# Patient Record
Sex: Male | Born: 1965
Health system: Southern US, Community
[De-identification: ages and names within clinical notes are randomized; demographics above are authoritative.]

## PROBLEM LIST (undated history)

## (undated) DIAGNOSIS — I10 Essential (primary) hypertension: Secondary | ICD-10-CM

## (undated) DIAGNOSIS — K859 Acute pancreatitis without necrosis or infection, unspecified: Secondary | ICD-10-CM

## (undated) HISTORY — PX: BILATERAL TEMPOROMANDIBULAR JOINT ARTHROPLASTY: SUR77

## (undated) HISTORY — PX: TRIGGER FINGER RELEASE: SHX641

---

## 2012-10-29 ENCOUNTER — Emergency Department (HOSPITAL_COMMUNITY)
Admission: EM | Admit: 2012-10-29 | Discharge: 2012-10-29 | Disposition: A | Discharge status: Home / Self Care | Payer: 59 | Attending: Emergency Medicine | Admitting: Emergency Medicine

## 2012-10-29 ENCOUNTER — Encounter (HOSPITAL_COMMUNITY): Payer: Self-pay | Admitting: Emergency Medicine

## 2012-10-29 ENCOUNTER — Emergency Department (HOSPITAL_COMMUNITY): Payer: 59

## 2012-10-29 DIAGNOSIS — F10239 Alcohol dependence with withdrawal, unspecified: Secondary | ICD-10-CM

## 2012-10-29 DIAGNOSIS — K529 Noninfective gastroenteritis and colitis, unspecified: Secondary | ICD-10-CM

## 2012-10-29 DIAGNOSIS — F1010 Alcohol abuse, uncomplicated: Secondary | ICD-10-CM | POA: Insufficient documentation

## 2012-10-29 DIAGNOSIS — F17200 Nicotine dependence, unspecified, uncomplicated: Secondary | ICD-10-CM | POA: Insufficient documentation

## 2012-10-29 DIAGNOSIS — R52 Pain, unspecified: Secondary | ICD-10-CM | POA: Insufficient documentation

## 2012-10-29 DIAGNOSIS — Z8719 Personal history of other diseases of the digestive system: Secondary | ICD-10-CM | POA: Insufficient documentation

## 2012-10-29 DIAGNOSIS — R197 Diarrhea, unspecified: Secondary | ICD-10-CM | POA: Insufficient documentation

## 2012-10-29 DIAGNOSIS — I10 Essential (primary) hypertension: Secondary | ICD-10-CM | POA: Insufficient documentation

## 2012-10-29 DIAGNOSIS — R509 Fever, unspecified: Secondary | ICD-10-CM | POA: Insufficient documentation

## 2012-10-29 DIAGNOSIS — K5289 Other specified noninfective gastroenteritis and colitis: Secondary | ICD-10-CM | POA: Insufficient documentation

## 2012-10-29 DIAGNOSIS — R059 Cough, unspecified: Secondary | ICD-10-CM | POA: Insufficient documentation

## 2012-10-29 DIAGNOSIS — E876 Hypokalemia: Secondary | ICD-10-CM | POA: Insufficient documentation

## 2012-10-29 DIAGNOSIS — R42 Dizziness and giddiness: Secondary | ICD-10-CM | POA: Insufficient documentation

## 2012-10-29 DIAGNOSIS — R05 Cough: Secondary | ICD-10-CM | POA: Insufficient documentation

## 2012-10-29 HISTORY — DX: Essential (primary) hypertension: I10

## 2012-10-29 HISTORY — DX: Acute pancreatitis without necrosis or infection, unspecified: K85.90

## 2012-10-29 LAB — CBC WITH DIFFERENTIAL/PLATELET
Basophils Absolute: 0 10*3/uL (ref 0.0–0.1)
Basophils Relative: 0 % (ref 0–1)
MCHC: 37.8 g/dL — ABNORMAL HIGH (ref 30.0–36.0)
Monocytes Absolute: 0.6 10*3/uL (ref 0.1–1.0)
Neutro Abs: 10.3 10*3/uL — ABNORMAL HIGH (ref 1.7–7.7)
Neutrophils Relative %: 92 % — ABNORMAL HIGH (ref 43–77)
RDW: 13 % (ref 11.5–15.5)

## 2012-10-29 LAB — URINALYSIS, ROUTINE W REFLEX MICROSCOPIC
Ketones, ur: 40 mg/dL — AB
Leukocytes, UA: NEGATIVE
Nitrite: NEGATIVE
Urobilinogen, UA: 1 mg/dL (ref 0.0–1.0)
pH: 6.5 (ref 5.0–8.0)

## 2012-10-29 LAB — COMPREHENSIVE METABOLIC PANEL
AST: 120 U/L — ABNORMAL HIGH (ref 0–37)
Albumin: 4 g/dL (ref 3.5–5.2)
Chloride: 86 mEq/L — ABNORMAL LOW (ref 96–112)
Creatinine, Ser: 0.71 mg/dL (ref 0.50–1.35)
Potassium: 2.6 mEq/L — CL (ref 3.5–5.1)
Total Bilirubin: 1.1 mg/dL (ref 0.3–1.2)

## 2012-10-29 MED ORDER — ONDANSETRON 8 MG PO TBDP: 8.0000 mg | ORAL_TABLET | Freq: Three times a day (TID) | ORAL | Status: DC | PRN

## 2012-10-29 MED ORDER — POTASSIUM CHLORIDE 10 MEQ/100ML IV SOLN
10.0000 meq | INTRAVENOUS | Status: AC
  Administered 2012-10-29 (×4): 10 meq via INTRAVENOUS
  Filled 2012-10-29 (×4): qty 100

## 2012-10-29 MED ORDER — DIPHENOXYLATE-ATROPINE 2.5-0.025 MG PO TABS: 1.0000 | ORAL_TABLET | Freq: Four times a day (QID) | ORAL | Status: DC | PRN

## 2012-10-29 MED ORDER — ONDANSETRON HCL 4 MG/2ML IJ SOLN
4.0000 mg | Freq: Once | INTRAMUSCULAR | Status: AC
  Administered 2012-10-29: 4 mg via INTRAVENOUS

## 2012-10-29 MED ORDER — LORAZEPAM 1 MG PO TABS: 1.0000 mg | ORAL_TABLET | Freq: Four times a day (QID) | ORAL | Status: DC | PRN

## 2012-10-29 MED ORDER — LORAZEPAM 2 MG/ML IJ SOLN: 1.0000 mg | Freq: Four times a day (QID) | INTRAMUSCULAR | Status: DC | PRN

## 2012-10-29 MED ORDER — SODIUM CHLORIDE 0.9 % IV BOLUS (SEPSIS)
1000.0000 mL | Freq: Once | INTRAVENOUS | Status: AC
  Administered 2012-10-29: 1000 mL via INTRAVENOUS

## 2012-10-29 MED ORDER — FOLIC ACID 1 MG PO TABS: 1.0000 mg | ORAL_TABLET | Freq: Every day | ORAL | Status: DC

## 2012-10-29 MED ORDER — POTASSIUM CHLORIDE CRYS ER 20 MEQ PO TBCR
40.0000 meq | EXTENDED_RELEASE_TABLET | Freq: Once | ORAL | Status: AC
  Administered 2012-10-29: 40 meq via ORAL
  Filled 2012-10-29: qty 2

## 2012-10-29 MED ORDER — DIPHENOXYLATE-ATROPINE 2.5-0.025 MG PO TABS
2.0000 | ORAL_TABLET | Freq: Once | ORAL | Status: AC
  Administered 2012-10-29: 2 via ORAL
  Filled 2012-10-29: qty 2

## 2012-10-29 MED ORDER — THIAMINE HCL 100 MG/ML IJ SOLN: 100.0000 mg | Freq: Every day | INTRAMUSCULAR | Status: DC

## 2012-10-29 MED ORDER — ADULT MULTIVITAMIN W/MINERALS CH: 1.0000 | ORAL_TABLET | Freq: Every day | ORAL | Status: DC

## 2012-10-29 MED ORDER — POTASSIUM CHLORIDE ER 10 MEQ PO TBCR: 20.0000 meq | EXTENDED_RELEASE_TABLET | Freq: Two times a day (BID) | ORAL | Status: DC

## 2012-10-29 MED ORDER — LORAZEPAM 2 MG/ML IJ SOLN
0.0000 mg | Freq: Four times a day (QID) | INTRAMUSCULAR | Status: DC
  Administered 2012-10-29 (×2): 2 mg via INTRAVENOUS
  Filled 2012-10-29: qty 2

## 2012-10-29 MED ORDER — ONDANSETRON HCL 4 MG/2ML IJ SOLN
INTRAMUSCULAR | Status: AC
  Administered 2012-10-29: 4 mg via INTRAVENOUS
  Filled 2012-10-29: qty 2

## 2012-10-29 MED ORDER — LORAZEPAM 2 MG/ML IJ SOLN: 0.0000 mg | Freq: Two times a day (BID) | INTRAMUSCULAR | Status: DC

## 2012-10-29 MED ORDER — VITAMIN B-1 100 MG PO TABS: 100.0000 mg | ORAL_TABLET | Freq: Every day | ORAL | Status: DC

## 2012-10-29 NOTE — ED Notes (Signed)
Pt reports hx of pancreatitis with similar symptoms. Reports taking alcohol 12/30 & 12/31.

## 2012-10-29 NOTE — ED Notes (Signed)
Patient is alert and oriented x3.  He was given DC instructions and follow up visit instructions.  Patient gave verbal understanding.  He was DC ambulatory under his own power to home.  V/S stable.  He was not showing any signs of distress on DC 

## 2012-10-29 NOTE — ED Provider Notes (Signed)
History     CSN: [id number]  Arrival date & time 10/29/12  0011   First MD Initiated Contact with Patient 10/29/12 0136      Chief Complaint  Patient presents with  . Nausea, vomiting and diarrhea     (Consider location/radiation/quality/duration/timing/severity/associated sxs/prior treatment) HPI This is a 47 year old male who developed flulike symptoms 4 days ago. By this he means body aches, fever and cough. For the past 2 days he has had nausea, vomiting and diarrhea. The symptoms have been moderate to severe. He's had fever to 102. He has felt lightheaded on standing and off balance when walking. He even vomited on arrival to the ED. He was treated with Zofran and an IV fluid bolus per protocol with improvement in his symptoms. He is somewhat evasive about his alcohol use but admits that some of his symptomatology may be due to his not having any alcohol for the past 2 days, notably he is very tremulous.  Past Medical History  Diagnosis Date  . Pancreatitis   . Hypertension     History reviewed. No pertinent past surgical history.  No family history on file.  History  Substance Use Topics  . Smoking status: Current Every Day Smoker  . Smokeless tobacco: Not on file  . Alcohol Use: Yes      Review of Systems  All other systems reviewed and are negative.    Allergies  Review of patient's allergies indicates no known allergies.  Home Medications   Current Outpatient Rx  Name  Route  Sig  Dispense  Refill  . ACETAMINOPHEN 325 MG PO TABS   Oral   Take 650 mg by mouth 2 (two) times daily as needed. For fever/pain         . NYQUIL D COLD/FLU PO   Oral   Take 15-30 mLs by mouth 2 (two) times daily as needed. For cold/flu symptoms           BP 133/100  Pulse 125  Temp 100.6 F (38.1 C) (Oral)  Resp 26  Wt 180 lb (81.647 kg)  SpO2 96%  Physical Exam General: Well-developed, well-nourished male in no acute distress; appearance consistent with age of  record HENT: normocephalic, atraumatic Eyes: pupils equal round and reactive to light; extraocular muscles intact Neck: supple Heart: regular rate and rhythm; tachycardia Lungs: clear to auscultation bilaterally Abdomen: soft; nondistended; nontender; no masses or hepatosplenomegaly; bowel sounds present Extremities: No deformity; full range of motion; pulses normal Neurologic: Awake, alert and oriented; motor function intact in all extremities and symmetric; no facial droop; tremulous Skin: Warm and dry Psychiatric: Normal mood and affect    ED Course  Procedures (including critical care time)    MDM  Nursing notes and vitals signs, including pulse oximetry, reviewed.  Summary of this visit's results, reviewed by myself:  Labs:  Results for orders placed during the hospital encounter of 10/29/12 (from the past 24 hour(s))  CBC WITH DIFFERENTIAL     Status: Abnormal   Collection Time   10/29/12 12:45 AM      Component Value Range   WBC 11.2 (*) 4.0 - 10.5 K/uL   RBC 4.71  4.22 - 5.81 MIL/uL   Hemoglobin 17.1 (*) 13.0 - 17.0 g/dL   HCT 66.4  40.3 - 47.4 %   MCV 96.0  78.0 - 100.0 fL   MCH 36.3 (*) 26.0 - 34.0 pg   MCHC 37.8 (*) 30.0 - 36.0 g/dL   RDW 25.9  56.3 -  15.5 %   Platelets 81 (*) 150 - 400 K/uL   Neutrophils Relative 92 (*) 43 - 77 %   Neutro Abs 10.3 (*) 1.7 - 7.7 K/uL   Lymphocytes Relative 3 (*) 12 - 46 %   Lymphs Abs 0.3 (*) 0.7 - 4.0 K/uL   Monocytes Relative 6  3 - 12 %   Monocytes Absolute 0.6  0.1 - 1.0 K/uL   Eosinophils Relative 0  0 - 5 %   Eosinophils Absolute 0.0  0.0 - 0.7 K/uL   Basophils Relative 0  0 - 1 %   Basophils Absolute 0.0  0.0 - 0.1 K/uL  COMPREHENSIVE METABOLIC PANEL     Status: Abnormal   Collection Time   10/29/12 12:45 AM      Component Value Range   Sodium 129 (*) 135 - 145 mEq/L   Potassium 2.6 (*) 3.5 - 5.1 mEq/L   Chloride 86 (*) 96 - 112 mEq/L   CO2 20  19 - 32 mEq/L   Glucose, Bld 183 (*) 70 - 99 mg/dL   BUN 8  6 - 23  mg/dL   Creatinine, Ser 6.96  0.50 - 1.35 mg/dL   Calcium 9.5  8.4 - 29.5 mg/dL   Total Protein 8.3  6.0 - 8.3 g/dL   Albumin 4.0  3.5 - 5.2 g/dL   AST 284 (*) 0 - 37 U/L   ALT 145 (*) 0 - 53 U/L   Alkaline Phosphatase 100  39 - 117 U/L   Total Bilirubin 1.1  0.3 - 1.2 mg/dL   GFR calc non Af Amer >90  >90 mL/min   GFR calc Af Amer >90  >90 mL/min  URINALYSIS, ROUTINE W REFLEX MICROSCOPIC     Status: Abnormal   Collection Time   10/29/12  2:40 AM      Component Value Range   Color, Urine AMBER (*) YELLOW   APPearance CLEAR  CLEAR   Specific Gravity, Urine 1.021  1.005 - 1.030   pH 6.5  5.0 - 8.0   Glucose, UA NEGATIVE  NEGATIVE mg/dL   Hgb urine dipstick SMALL (*) NEGATIVE   Bilirubin Urine MODERATE (*) NEGATIVE   Ketones, ur 40 (*) NEGATIVE mg/dL   Protein, ur >132 (*) NEGATIVE mg/dL   Urobilinogen, UA 1.0  0.0 - 1.0 mg/dL   Nitrite NEGATIVE  NEGATIVE   Leukocytes, UA NEGATIVE  NEGATIVE  URINE MICROSCOPIC-ADD ON     Status: Abnormal   Collection Time   10/29/12  2:40 AM      Component Value Range   RBC / HPF 3-6  <3 RBC/hpf   Bacteria, UA RARE  RARE   Casts HYALINE CASTS (*) NEGATIVE    Imaging Studies: Dg Abd Acute W/chest  10/29/2012  *RADIOLOGY REPORT*  Clinical Data: Worsening nausea and vomiting; weakness and mid abdominal pain.  Fever.  ACUTE ABDOMEN SERIES (ABDOMEN 2 VIEW & CHEST 1 VIEW)  Comparison: None.  Findings: The lungs are well-aerated and appear grossly clear. There is no evidence of focal opacification, pleural effusion or pneumothorax.  The cardiomediastinal silhouette is within normal limits.  Bilateral nipple shadows are seen.  The visualized bowel gas pattern is unremarkable.  Scattered stool and air are seen within the colon; there is no evidence of small bowel dilatation to suggest obstruction.  The stomach is partially filled with fluid and air.  No free intra-abdominal air is identified on the provided upright view.  No acute osseous abnormalities are seen;  mild sclerotic change is noted at the sacroiliac joints.  Mild degenerative change is noted at the inferior right glenoid.  IMPRESSION:  1.  Unremarkable bowel gas pattern; no free intra-abdominal air seen. 2.  No acute cardiopulmonary process identified.   Original Report Authenticated By: Tonia Ghent, M.D.    6:18 AM Patient's tremor, tachycardia and malaise significantly improved after IV Ativan, Zofran and 2 L of normal saline. He has also been given 40 mEq of potassium chloride IV for his hypokalemia. His been able to drink fluids without emesis.          Hanley Seamen, MD 10/29/12 [phone]

## 2012-10-29 NOTE — ED Notes (Signed)
Pt alert, arrives from home, c/o vomiting and diarrhea, onset was yesterday, per family recent ill exposures, resp even unlabored, skin pwd, emesis in triage

## 2012-10-29 NOTE — ED Notes (Signed)
Patient transported to X-ray 

## 2012-10-29 NOTE — ED Notes (Signed)
2.6 K reported to Dr. Read Drivers

## 2014-10-30 ENCOUNTER — Other Ambulatory Visit: Payer: Self-pay | Admitting: Internal Medicine

## 2014-10-30 DIAGNOSIS — R7989 Other specified abnormal findings of blood chemistry: Secondary | ICD-10-CM

## 2014-10-30 DIAGNOSIS — R945 Abnormal results of liver function studies: Principal | ICD-10-CM

## 2014-11-22 ENCOUNTER — Inpatient Hospital Stay: Admission: RE | Admit: 2014-11-22 | Payer: Self-pay | Source: Ambulatory Visit

## 2015-11-19 ENCOUNTER — Encounter (HOSPITAL_COMMUNITY): Payer: Self-pay | Admitting: Emergency Medicine

## 2015-11-19 ENCOUNTER — Emergency Department (HOSPITAL_COMMUNITY): Payer: 59

## 2015-11-19 ENCOUNTER — Emergency Department (HOSPITAL_COMMUNITY)
Admission: EM | Admit: 2015-11-19 | Discharge: 2015-11-20 | Disposition: A | Discharge status: Home / Self Care | Payer: 59 | Attending: Emergency Medicine | Admitting: Emergency Medicine

## 2015-11-19 DIAGNOSIS — R410 Disorientation, unspecified: Secondary | ICD-10-CM | POA: Diagnosis not present

## 2015-11-19 DIAGNOSIS — F17210 Nicotine dependence, cigarettes, uncomplicated: Secondary | ICD-10-CM | POA: Insufficient documentation

## 2015-11-19 DIAGNOSIS — Z7289 Other problems related to lifestyle: Secondary | ICD-10-CM

## 2015-11-19 DIAGNOSIS — R1110 Vomiting, unspecified: Secondary | ICD-10-CM | POA: Diagnosis present

## 2015-11-19 DIAGNOSIS — F10920 Alcohol use, unspecified with intoxication, uncomplicated: Secondary | ICD-10-CM | POA: Insufficient documentation

## 2015-11-19 DIAGNOSIS — Z8719 Personal history of other diseases of the digestive system: Secondary | ICD-10-CM | POA: Diagnosis not present

## 2015-11-19 DIAGNOSIS — R509 Fever, unspecified: Secondary | ICD-10-CM | POA: Diagnosis not present

## 2015-11-19 DIAGNOSIS — I10 Essential (primary) hypertension: Secondary | ICD-10-CM | POA: Diagnosis not present

## 2015-11-19 DIAGNOSIS — F1090 Alcohol use, unspecified, uncomplicated: Secondary | ICD-10-CM

## 2015-11-19 LAB — URINALYSIS, ROUTINE W REFLEX MICROSCOPIC
BILIRUBIN URINE: NEGATIVE
GLUCOSE, UA: NEGATIVE mg/dL
Hgb urine dipstick: NEGATIVE
KETONES UR: NEGATIVE mg/dL
LEUKOCYTES UA: NEGATIVE
NITRITE: NEGATIVE
PH: 7 (ref 5.0–8.0)
Protein, ur: NEGATIVE mg/dL
SPECIFIC GRAVITY, URINE: 1.006 (ref 1.005–1.030)

## 2015-11-19 LAB — COMPREHENSIVE METABOLIC PANEL
ALT: 80 U/L — ABNORMAL HIGH (ref 17–63)
ANION GAP: 14 (ref 5–15)
AST: 88 U/L — ABNORMAL HIGH (ref 15–41)
Albumin: 3.7 g/dL (ref 3.5–5.0)
Alkaline Phosphatase: 72 U/L (ref 38–126)
BUN: <5 mg/dL — ABNORMAL LOW (ref 6–20)
CALCIUM: 9.4 mg/dL (ref 8.9–10.3)
CHLORIDE: 109 mmol/L (ref 101–111)
CO2: 25 mmol/L (ref 22–32)
Creatinine, Ser: 0.85 mg/dL (ref 0.61–1.24)
GFR calc non Af Amer: >60 mL/min (ref >60)
Glucose, Bld: 110 mg/dL — ABNORMAL HIGH (ref 65–99)
POTASSIUM: 3.7 mmol/L (ref 3.5–5.1)
SODIUM: 148 mmol/L — AB (ref 135–145)
Total Bilirubin: 0.4 mg/dL (ref 0.3–1.2)
Total Protein: 6.9 g/dL (ref 6.5–8.1)

## 2015-11-19 LAB — CBC WITH DIFFERENTIAL/PLATELET
BASOS ABS: 0.1 10*3/uL (ref 0.0–0.1)
Basophils Relative: 2 %
EOS PCT: 6 %
Eosinophils Absolute: 0.4 10*3/uL (ref 0.0–0.7)
HCT: 42.2 % (ref 39.0–52.0)
HEMOGLOBIN: 14.5 g/dL (ref 13.0–17.0)
LYMPHS ABS: 2.7 10*3/uL (ref 0.7–4.0)
LYMPHS PCT: 40 %
MCH: 37.2 pg — AB (ref 26.0–34.0)
MCHC: 34.4 g/dL (ref 30.0–36.0)
MCV: 108.2 fL — AB (ref 78.0–100.0)
Monocytes Absolute: 0.8 10*3/uL (ref 0.1–1.0)
Monocytes Relative: 12 %
NEUTROS PCT: 40 %
Neutro Abs: 2.7 10*3/uL (ref 1.7–7.7)
PLATELETS: 170 10*3/uL (ref 150–400)
RBC: 3.9 MIL/uL — AB (ref 4.22–5.81)
RDW: 12.8 % (ref 11.5–15.5)
WBC: 6.7 10*3/uL (ref 4.0–10.5)

## 2015-11-19 LAB — I-STAT CG4 LACTIC ACID, ED
Lactic Acid, Venous: 2.15 mmol/L (ref 0.5–2.0)
Lactic Acid, Venous: 2.28 mmol/L (ref 0.5–2.0)

## 2015-11-19 LAB — ETHANOL: ALCOHOL ETHYL (B): 360 mg/dL — AB (ref <5)

## 2015-11-19 MED ORDER — LORAZEPAM 2 MG/ML IJ SOLN
1.0000 mg | Freq: Once | INTRAMUSCULAR | Status: AC
  Administered 2015-11-19: 1 mg via INTRAVENOUS
  Filled 2015-11-19: qty 1

## 2015-11-19 MED ORDER — VITAMIN B-1 100 MG PO TABS
100.0000 mg | ORAL_TABLET | Freq: Once | ORAL | Status: AC
  Administered 2015-11-19: 100 mg via ORAL
  Filled 2015-11-19: qty 1

## 2015-11-19 NOTE — ED Notes (Signed)
Critical ETOH 360, from [place] in lab.

## 2015-11-19 NOTE — ED Notes (Signed)
Urinal provided to pt

## 2015-11-19 NOTE — ED Notes (Addendum)
Pt c/o fever, vomiting x 3 days. Pt oral temperature reading 97.7 at this time. Pt does feel warmer than that. Per family, pt had fever of 102.3 at home. Pt c/o productive cough, weakness, SOB. A&Ox4. Pt appears very fatigued sitting in wheelchair. Pt has difficulty answering questions and is speaking very slowly. Pt has been confused per family.

## 2015-11-19 NOTE — ED Notes (Signed)
Requested patient to urinate. 

## 2015-11-19 NOTE — ED Provider Notes (Signed)
CSN: [id number]     Arrival date & time 11/19/15  1901 History   First MD Initiated Contact with Patient 11/19/15 1948     Chief Complaint  Patient presents with  . Fever  . Emesis     (Consider location/radiation/quality/duration/timing/severity/associated sxs/prior Treatment) Patient is a 50 y.o. male presenting with vomiting. The history is provided by the patient and a relative.  Emesis Severity:  Moderate Duration:  3 days Timing:  Constant Quality:  Stomach contents Progression:  Worsening Chronicity:  New Recent urination:  Normal Relieved by:  Nothing Worsened by:  Nothing tried Ineffective treatments:  None tried Associated symptoms: chills and fever   Associated symptoms comment:  Sweating Risk factors: alcohol use     Past Medical History  Diagnosis Date  . Pancreatitis   . Hypertension    History reviewed. No pertinent past surgical history. No family history on file. Social History  Substance Use Topics  . Smoking status: Current Every Day Smoker  . Smokeless tobacco: None  . Alcohol Use: Yes    Review of Systems  Constitutional: Positive for fever and chills.  Gastrointestinal: Positive for vomiting.  Psychiatric/Behavioral: Positive for confusion.  All other systems reviewed and are negative.     Allergies  Tramadol  Home Medications   Prior to Admission medications   Medication Sig Start Date End Date Taking? Authorizing Provider  acetaminophen (TYLENOL) 500 MG tablet Take 500-1,000 mg by mouth every 6 (six) hours as needed for mild pain, moderate pain or headache.   Yes Historical Provider, MD  Phenyleph-CPM-DM-Aspirin (ALKA-SELTZER PLUS COLD & COUGH PO) Take 1 each by mouth every 8 (eight) hours as needed (cold symptoms).   Yes Historical Provider, MD  diphenoxylate-atropine (LOMOTIL) 2.5-0.025 MG per tablet Take 1-2 tablets by mouth 4 (four) times daily as needed for diarrhea or loose stools. Patient not taking: Reported on 11/19/2015  10/29/12   Shanon Rosser, MD  potassium chloride (K-DUR) 10 MEQ tablet Take 2 tablets (20 mEq total) by mouth 2 (two) times daily. Patient not taking: Reported on 11/19/2015 10/29/12   John Molpus, MD   BP 100/76 mmHg  Pulse 116  Temp(Src) 98.4 F (36.9 C) (Rectal)  Resp 16  SpO2 92% Physical Exam  Constitutional: He is oriented to person, place, and time. He appears well-developed and well-nourished. No distress.  HENT:  Head: Normocephalic and atraumatic.  Eyes: Conjunctivae are normal.  Neck: Neck supple. No tracheal deviation present.  Cardiovascular: Normal rate and regular rhythm.   Pulmonary/Chest: Effort normal. No respiratory distress.  Abdominal: Soft. He exhibits no distension.  Neurological: He is alert and oriented to person, place, and time. No cranial nerve deficit. GCS eye subscore is 4. GCS verbal subscore is 5. GCS motor subscore is 6.  Skin: Skin is warm. He is diaphoretic.  Psychiatric: His speech is slurred. He is slowed.    ED Course  Procedures (including critical care time) Labs Review Labs Reviewed  COMPREHENSIVE METABOLIC PANEL - Abnormal; Notable for the following:    Sodium 148 (*)    Glucose, Bld 110 (*)    BUN <5 (*)    AST 88 (*)    ALT 80 (*)    All other components within normal limits  CBC WITH DIFFERENTIAL/PLATELET - Abnormal; Notable for the following:    RBC 3.90 (*)    MCV 108.2 (*)    MCH 37.2 (*)    All other components within normal limits  ETHANOL - Abnormal; Notable for the  following:    Alcohol, Ethyl (B) 360 (*)    All other components within normal limits  I-STAT CG4 LACTIC ACID, ED - Abnormal; Notable for the following:    Lactic Acid, Venous 2.15 (*)    All other components within normal limits  CULTURE, BLOOD (ROUTINE X 2)  CULTURE, BLOOD (ROUTINE X 2)  URINE CULTURE  URINALYSIS, ROUTINE W REFLEX MICROSCOPIC (NOT AT [facility])  I-STAT CG4 LACTIC ACID, ED    Imaging Review Dg Chest Port 1 View  11/19/2015  CLINICAL DATA:  Pt  unable to properly communicate history. Per ED notes:Pt c/o fever, vomiting x 3 days. Pt oral temperature reading 97.7 at this time. Fever at home. Productive cough, weakness, shortness of breath. Confusion. EXAM: PORTABLE CHEST 1 VIEW COMPARISON:  10/29/2012 FINDINGS: Shallow lung inflation. Subsegmental atelectasis or scarring identified at both lung bases. There are no consolidations. No pleural effusions or evidence for pulmonary edema. IMPRESSION: Bibasilar atelectasis or scarring. Electronically Signed   By: Nolon Nations M.D.   On: 11/19/2015 20:05   I have personally reviewed and evaluated these images and lab results as part of my medical decision-making.   EKG Interpretation None      MDM   Final diagnoses:  Alcohol intake above recommended sensible limits without complication [facility])    50 year old male presents with vomiting and concern for fevers that started 4 days ago and were last noted earlier today. Denying diarrhea. On arrival patient is sleepy, sweating profusely, appears mildly confused and with report of fevers and confusion there is clinical concern for withdrawal syndrome or wernicke encephalopathy. Provided Ativan and thiamine for empiric treatment pending ethanol level and fluids for mild clinical dehydration. Patient is apparently still able to consume his normal amount of liquor or potentially more given his level of 360. At this time appears that patient is clinically intoxicated and has slight lactic acidosis secondary to acute alcohol ingestion rather than infectious etiology. No leukocytosis, no signs of infection on chest x-ray or urinalysis, patient will need to metabolize alcohol and achieve clinical sobriety prior to discharge. Care transferred to Dr [facility] pending re-evaluation.    Leo Grosser, MD 11/21/15 [phone]

## 2015-11-19 NOTE — ED Notes (Signed)
Family requesting nicotine patch for pt

## 2015-11-20 LAB — URINE CULTURE: CULTURE: NO GROWTH

## 2015-11-20 NOTE — ED Notes (Signed)
Patient walked without any assistance. Patient did not fall or tumble. Patient is requesting to go home.

## 2015-11-20 NOTE — ED Provider Notes (Signed)
12:33 AM Able to ambulate without difficulty. States he is ready to leave.    Shanon Rosser, MD 11/20/15 [phone]

## 2015-11-24 LAB — CULTURE, BLOOD (ROUTINE X 2)
CULTURE: NO GROWTH
Culture: NO GROWTH

## 2016-05-25 ENCOUNTER — Observation Stay (HOSPITAL_COMMUNITY)
Admission: EM | Admit: 2016-05-25 | Discharge: 2016-05-28 | Disposition: A | Discharge status: Home / Self Care | Payer: 59 | Attending: Family Medicine | Admitting: Family Medicine

## 2016-05-25 ENCOUNTER — Encounter (HOSPITAL_COMMUNITY): Payer: Self-pay | Admitting: Emergency Medicine

## 2016-05-25 DIAGNOSIS — E878 Other disorders of electrolyte and fluid balance, not elsewhere classified: Secondary | ICD-10-CM | POA: Diagnosis present

## 2016-05-25 DIAGNOSIS — I10 Essential (primary) hypertension: Secondary | ICD-10-CM | POA: Insufficient documentation

## 2016-05-25 DIAGNOSIS — E871 Hypo-osmolality and hyponatremia: Secondary | ICD-10-CM | POA: Diagnosis not present

## 2016-05-25 DIAGNOSIS — I4581 Long QT syndrome: Secondary | ICD-10-CM | POA: Diagnosis not present

## 2016-05-25 DIAGNOSIS — E876 Hypokalemia: Secondary | ICD-10-CM | POA: Diagnosis not present

## 2016-05-25 DIAGNOSIS — R112 Nausea with vomiting, unspecified: Secondary | ICD-10-CM | POA: Diagnosis present

## 2016-05-25 DIAGNOSIS — F17200 Nicotine dependence, unspecified, uncomplicated: Secondary | ICD-10-CM | POA: Diagnosis not present

## 2016-05-25 DIAGNOSIS — R748 Abnormal levels of other serum enzymes: Secondary | ICD-10-CM | POA: Insufficient documentation

## 2016-05-25 DIAGNOSIS — R197 Diarrhea, unspecified: Secondary | ICD-10-CM

## 2016-05-25 DIAGNOSIS — M1990 Unspecified osteoarthritis, unspecified site: Secondary | ICD-10-CM | POA: Insufficient documentation

## 2016-05-25 DIAGNOSIS — A047 Enterocolitis due to Clostridium difficile: Principal | ICD-10-CM | POA: Insufficient documentation

## 2016-05-25 DIAGNOSIS — A0472 Enterocolitis due to Clostridium difficile, not specified as recurrent: Secondary | ICD-10-CM | POA: Diagnosis present

## 2016-05-25 DIAGNOSIS — R945 Abnormal results of liver function studies: Secondary | ICD-10-CM

## 2016-05-25 DIAGNOSIS — F1010 Alcohol abuse, uncomplicated: Secondary | ICD-10-CM | POA: Diagnosis present

## 2016-05-25 DIAGNOSIS — K859 Acute pancreatitis without necrosis or infection, unspecified: Secondary | ICD-10-CM | POA: Diagnosis present

## 2016-05-25 DIAGNOSIS — R7989 Other specified abnormal findings of blood chemistry: Secondary | ICD-10-CM | POA: Diagnosis present

## 2016-05-25 LAB — CBC
HEMATOCRIT: 36.8 % — AB (ref 39.0–52.0)
HEMOGLOBIN: 13.8 g/dL (ref 13.0–17.0)
MCH: 36.9 pg — AB (ref 26.0–34.0)
MCHC: 37.5 g/dL — AB (ref 30.0–36.0)
MCV: 98.4 fL (ref 78.0–100.0)
Platelets: 332 10*3/uL (ref 150–400)
RBC: 3.74 MIL/uL — ABNORMAL LOW (ref 4.22–5.81)
RDW: 12.2 % (ref 11.5–15.5)
WBC: 10.1 10*3/uL (ref 4.0–10.5)

## 2016-05-25 LAB — COMPREHENSIVE METABOLIC PANEL
ALBUMIN: 4.1 g/dL (ref 3.5–5.0)
ALK PHOS: 102 U/L (ref 38–126)
ALT: 83 U/L — ABNORMAL HIGH (ref 17–63)
ANION GAP: 20 — AB (ref 5–15)
AST: 67 U/L — ABNORMAL HIGH (ref 15–41)
BUN: 19 mg/dL (ref 6–20)
CALCIUM: 9 mg/dL (ref 8.9–10.3)
CO2: 32 mmol/L (ref 22–32)
Chloride: 69 mmol/L — ABNORMAL LOW (ref 101–111)
Creatinine, Ser: 0.93 mg/dL (ref 0.61–1.24)
GFR calc Af Amer: >60 mL/min (ref >60)
GFR calc non Af Amer: >60 mL/min (ref >60)
GLUCOSE: 114 mg/dL — AB (ref 65–99)
POTASSIUM: 2.1 mmol/L — AB (ref 3.5–5.1)
SODIUM: 121 mmol/L — AB (ref 135–145)
Total Bilirubin: 2 mg/dL — ABNORMAL HIGH (ref 0.3–1.2)
Total Protein: 8.4 g/dL — ABNORMAL HIGH (ref 6.5–8.1)

## 2016-05-25 LAB — LIPASE, BLOOD: Lipase: 371 U/L — ABNORMAL HIGH (ref 11–51)

## 2016-05-25 MED ORDER — POTASSIUM CHLORIDE 10 MEQ/100ML IV SOLN
10.0000 meq | INTRAVENOUS | Status: AC
  Administered 2016-05-25 – 2016-05-26 (×6): 10 meq via INTRAVENOUS
  Filled 2016-05-25 (×6): qty 100

## 2016-05-25 MED ORDER — SODIUM CHLORIDE 0.9 % IV BOLUS (SEPSIS)
2000.0000 mL | Freq: Once | INTRAVENOUS | Status: AC
  Administered 2016-05-25: 2000 mL via INTRAVENOUS

## 2016-05-25 NOTE — ED Provider Notes (Signed)
[facility] DEPT Provider Note   CSN: [id number] Arrival date & time: 05/25/16  2152  First Provider Contact:  First MD Initiated Contact with Patient 05/25/16 2337     By signing my name below, I, Higinio Plan, attest that this documentation has been prepared under the direction and in the presence of Shanon Rosser, MD . Electronically Signed: Higinio Plan, Scribe. 05/25/2016. 11:45 PM.  History   Chief Complaint Chief Complaint  Patient presents with  . Vomiting    HPI Comments: Jesse Sparks is a 50 y.o. male with PMHx of HTN and pancreatitis, who presents to the Emergency Department complaining of nausea and multiple episodes of vomiting that began ~1 week ago. He has also had 3 episodes of diarrhea. The vomiting has progressed to dry heaving. He reports associated muscle aches, abdominal wall pain which he attributes to vomiting, and generalized weakness. His rates his pain a 5 out of 10. He states he has experienced similar symptoms once before. He notes he lost ~20 lbs this past week due to inability to tolerate foods and liquids. He states he has taken Bonine for his symptoms with mild relief. He denies drinking alcohol this week. He denies dry mouth.   HPI  Past Medical History:  Diagnosis Date  . Hypertension   . Pancreatitis     There are no active problems to display for this patient.   Past Surgical History:  Procedure Laterality Date  . BILATERAL TEMPOROMANDIBULAR JOINT ARTHROPLASTY    . TRIGGER FINGER RELEASE      Home Medications    Prior to Admission medications   Medication Sig Start Date End Date Taking? Authorizing Provider  acetaminophen (TYLENOL) 500 MG tablet Take 500-1,000 mg by mouth every 6 (six) hours as needed for mild pain, moderate pain or headache.    Historical Provider, MD  diphenoxylate-atropine (LOMOTIL) 2.5-0.025 MG per tablet Take 1-2 tablets by mouth 4 (four) times daily as needed for diarrhea or loose stools. Patient not taking: Reported  on 11/19/2015 10/29/12   Shanon Rosser, MD  Phenyleph-CPM-DM-Aspirin (ALKA-SELTZER PLUS COLD & COUGH PO) Take 1 each by mouth every 8 (eight) hours as needed (cold symptoms).    Historical Provider, MD  potassium chloride (K-DUR) 10 MEQ tablet Take 2 tablets (20 mEq total) by mouth 2 (two) times daily. Patient not taking: Reported on 11/19/2015 10/29/12   Shanon Rosser, MD    Family History History reviewed. No pertinent family history.  Social History Social History  Substance Use Topics  . Smoking status: Current Every Day Smoker  . Smokeless tobacco: Never Used  . Alcohol use Yes     Allergies   Tramadol   Review of Systems Review of Systems 10 systems reviewed and all are negative for acute change except as noted in the HPI.  Physical Exam Updated Vital Signs BP 112/87 (BP Location: Left Arm)   Pulse 109   Temp 99.3 F (37.4 C) (Oral)   Resp 18   Ht 5\' 8"  (1.727 m)   Wt 165 lb 4 oz (75 kg)   SpO2 100%   BMI 25.13 kg/m   Physical Exam General: Well-developed male in no acute distress; appearance consistent with age of record HENT: normocephalic; atraumatic Eyes: pupils equal, round and reactive to light; extraocular muscles intact Neck: supple Heart: regular rate and rhythm; tachycardia Lungs: clear to auscultation bilaterally Abdomen: soft; nondistended; RLQ tenderness; no masses or hepatosplenomegaly; bowel sounds present Extremities: No deformity; full range of motion; pulses normal Neurologic: Awake,  alert and oriented; motor function intact in all extremities and symmetric; no facial droop Skin: Warm and dry Psychiatric: Normal mood and affect  ED Treatments / Results   Nursing notes and vitals signs, including pulse oximetry, reviewed.  Summary of this visit's results, reviewed by myself:  Labs:  Results for orders placed or performed during the hospital encounter of 05/25/16 (from the past 24 hour(s))  Lipase, blood     Status: Abnormal   Collection Time:  05/25/16 10:43 PM  Result Value Ref Range   Lipase 371 (H) 11 - 51 U/L  Comprehensive metabolic panel     Status: Abnormal   Collection Time: 05/25/16 10:43 PM  Result Value Ref Range   Sodium 121 (L) 135 - 145 mmol/L   Potassium 2.1 (LL) 3.5 - 5.1 mmol/L   Chloride 69 (L) 101 - 111 mmol/L   CO2 32 22 - 32 mmol/L   Glucose, Bld 114 (H) 65 - 99 mg/dL   BUN 19 6 - 20 mg/dL   Creatinine, Ser 0.93 0.61 - 1.24 mg/dL   Calcium 9.0 8.9 - 10.3 mg/dL   Total Protein 8.4 (H) 6.5 - 8.1 g/dL   Albumin 4.1 3.5 - 5.0 g/dL   AST 67 (H) 15 - 41 U/L   ALT 83 (H) 17 - 63 U/L   Alkaline Phosphatase 102 38 - 126 U/L   Total Bilirubin 2.0 (H) 0.3 - 1.2 mg/dL   GFR calc non Af Amer >60 >60 mL/min   GFR calc Af Amer >60 >60 mL/min   Anion gap 20 (H) 5 - 15  CBC     Status: Abnormal   Collection Time: 05/25/16 10:43 PM  Result Value Ref Range   WBC 10.1 4.0 - 10.5 K/uL   RBC 3.74 (L) 4.22 - 5.81 MIL/uL   Hemoglobin 13.8 13.0 - 17.0 g/dL   HCT 36.8 (L) 39.0 - 52.0 %   MCV 98.4 78.0 - 100.0 fL   MCH 36.9 (H) 26.0 - 34.0 pg   MCHC 37.5 (H) 30.0 - 36.0 g/dL   RDW 12.2 11.5 - 15.5 %   Platelets 332 150 - 400 K/uL    EKG Interpretation  Date/Time:  Sunday May 25 2016 23:53:09 EDT Ventricular Rate:  95 PR Interval:    QRS Duration: 109 QT Interval:  407 QTC Calculation: 512 R Axis:   91 Text Interpretation:  Sinus rhythm Borderline right axis deviation Prolonged QT interval No previous ECGs available Confirmed by Johnryan Sao  MD, Jenny Reichmann ([id number]) on 05/26/2016 12:04:07 AM       Procedures   Final Clinical Impressions(s) / ED Diagnoses  12:07 AM IV fluid bolus and IV runs of potassium initiated.   12:32 AM Dr. Hal Hope to admit to stepdown.  Final diagnoses:  Nausea, vomiting and diarrhea  Hyponatremia  Hypokalemia, gastrointestinal losses  Elevated lipase   I personally performed the services described in this documentation, which was scribed in my presence. The recorded information has  been reviewed and is accurate.     Shanon Rosser, MD 05/26/16 [phone]

## 2016-05-25 NOTE — ED Triage Notes (Signed)
Pt states he has had nausea, vomiting, and diarrhea for one week  Denies abd pain just severe nausea  Pt states he normally weighs about 185 and today actual weight is 165.4

## 2016-05-26 ENCOUNTER — Observation Stay (HOSPITAL_COMMUNITY): Payer: 59

## 2016-05-26 ENCOUNTER — Encounter (HOSPITAL_COMMUNITY): Payer: Self-pay | Admitting: Internal Medicine

## 2016-05-26 DIAGNOSIS — R112 Nausea with vomiting, unspecified: Secondary | ICD-10-CM | POA: Diagnosis not present

## 2016-05-26 DIAGNOSIS — E871 Hypo-osmolality and hyponatremia: Secondary | ICD-10-CM | POA: Diagnosis present

## 2016-05-26 DIAGNOSIS — R7989 Other specified abnormal findings of blood chemistry: Secondary | ICD-10-CM | POA: Diagnosis not present

## 2016-05-26 DIAGNOSIS — R945 Abnormal results of liver function studies: Secondary | ICD-10-CM

## 2016-05-26 DIAGNOSIS — F1010 Alcohol abuse, uncomplicated: Secondary | ICD-10-CM | POA: Diagnosis present

## 2016-05-26 DIAGNOSIS — E878 Other disorders of electrolyte and fluid balance, not elsewhere classified: Secondary | ICD-10-CM | POA: Diagnosis present

## 2016-05-26 DIAGNOSIS — K859 Acute pancreatitis without necrosis or infection, unspecified: Secondary | ICD-10-CM | POA: Diagnosis present

## 2016-05-26 DIAGNOSIS — R197 Diarrhea, unspecified: Secondary | ICD-10-CM

## 2016-05-26 LAB — DIFFERENTIAL
BASOS ABS: 0.1 10*3/uL (ref 0.0–0.1)
Basophils Relative: 1 %
Eosinophils Absolute: 0.1 10*3/uL (ref 0.0–0.7)
Eosinophils Relative: 1 %
LYMPHS ABS: 1.3 10*3/uL (ref 0.7–4.0)
LYMPHS PCT: 13 %
Monocytes Absolute: 1.4 10*3/uL — ABNORMAL HIGH (ref 0.1–1.0)
Monocytes Relative: 13 %
NEUTROS ABS: 7.3 10*3/uL (ref 1.7–7.7)
NEUTROS PCT: 72 %

## 2016-05-26 LAB — URINALYSIS, ROUTINE W REFLEX MICROSCOPIC
Glucose, UA: NEGATIVE mg/dL
Hgb urine dipstick: NEGATIVE
Ketones, ur: 15 mg/dL — AB
LEUKOCYTES UA: NEGATIVE
NITRITE: NEGATIVE
PH: 6 (ref 5.0–8.0)
Protein, ur: NEGATIVE mg/dL
SPECIFIC GRAVITY, URINE: 1.015 (ref 1.005–1.030)

## 2016-05-26 LAB — SODIUM, URINE, RANDOM

## 2016-05-26 LAB — CK: CK TOTAL: 46 U/L — AB (ref 49–397)

## 2016-05-26 LAB — HEPATIC FUNCTION PANEL
ALK PHOS: 77 U/L (ref 38–126)
ALT: 60 U/L (ref 17–63)
AST: 40 U/L (ref 15–41)
Albumin: 3.3 g/dL — ABNORMAL LOW (ref 3.5–5.0)
BILIRUBIN INDIRECT: 1 mg/dL — AB (ref 0.3–0.9)
BILIRUBIN TOTAL: 1.3 mg/dL — AB (ref 0.3–1.2)
Bilirubin, Direct: 0.3 mg/dL (ref 0.1–0.5)
TOTAL PROTEIN: 6.8 g/dL (ref 6.5–8.1)

## 2016-05-26 LAB — BASIC METABOLIC PANEL
Anion gap: 13 (ref 5–15)
Anion gap: 12 (ref 5–15)
Anion gap: 12 (ref 5–15)
Anion gap: 10 (ref 5–15)
BUN: 14 mg/dL (ref 6–20)
BUN: 14 mg/dL (ref 6–20)
BUN: 13 mg/dL (ref 6–20)
BUN: 13 mg/dL (ref 6–20)
CHLORIDE: 81 mmol/L — AB (ref 101–111)
CHLORIDE: 81 mmol/L — AB (ref 101–111)
CHLORIDE: 84 mmol/L — AB (ref 101–111)
CHLORIDE: 86 mmol/L — AB (ref 101–111)
CO2: 31 mmol/L (ref 22–32)
CO2: 32 mmol/L (ref 22–32)
CO2: 32 mmol/L (ref 22–32)
CO2: 32 mmol/L (ref 22–32)
CREATININE: 0.69 mg/dL (ref 0.61–1.24)
CREATININE: 0.79 mg/dL (ref 0.61–1.24)
Calcium: 7.9 mg/dL — ABNORMAL LOW (ref 8.9–10.3)
Calcium: 8.2 mg/dL — ABNORMAL LOW (ref 8.9–10.3)
Calcium: 8.2 mg/dL — ABNORMAL LOW (ref 8.9–10.3)
Calcium: 8.6 mg/dL — ABNORMAL LOW (ref 8.9–10.3)
Creatinine, Ser: 0.85 mg/dL (ref 0.61–1.24)
Creatinine, Ser: 0.78 mg/dL (ref 0.61–1.24)
GFR calc Af Amer: >60 mL/min (ref >60)
GFR calc Af Amer: >60 mL/min (ref >60)
GFR calc Af Amer: >60 mL/min (ref >60)
GFR calc Af Amer: >60 mL/min (ref >60)
GFR calc non Af Amer: >60 mL/min (ref >60)
GFR calc non Af Amer: >60 mL/min (ref >60)
GFR calc non Af Amer: >60 mL/min (ref >60)
GFR calc non Af Amer: >60 mL/min (ref >60)
GLUCOSE: 88 mg/dL (ref 65–99)
GLUCOSE: 91 mg/dL (ref 65–99)
GLUCOSE: 85 mg/dL (ref 65–99)
GLUCOSE: 100 mg/dL — AB (ref 65–99)
POTASSIUM: 2.6 mmol/L — AB (ref 3.5–5.1)
POTASSIUM: 2.5 mmol/L — AB (ref 3.5–5.1)
POTASSIUM: 3.1 mmol/L — AB (ref 3.5–5.1)
POTASSIUM: 3.2 mmol/L — AB (ref 3.5–5.1)
SODIUM: 128 mmol/L — AB (ref 135–145)
Sodium: 125 mmol/L — ABNORMAL LOW (ref 135–145)
Sodium: 125 mmol/L — ABNORMAL LOW (ref 135–145)
Sodium: 128 mmol/L — ABNORMAL LOW (ref 135–145)

## 2016-05-26 LAB — MAGNESIUM
MAGNESIUM: 1.3 mg/dL — AB (ref 1.7–2.4)
MAGNESIUM: 1.9 mg/dL (ref 1.7–2.4)

## 2016-05-26 LAB — CBC WITH DIFFERENTIAL/PLATELET
BASOS ABS: 0.1 10*3/uL (ref 0.0–0.1)
Basophils Relative: 1 %
EOS ABS: 0.1 10*3/uL (ref 0.0–0.7)
EOS PCT: 1 %
HCT: 31.1 % — ABNORMAL LOW (ref 39.0–52.0)
Hemoglobin: 11.4 g/dL — ABNORMAL LOW (ref 13.0–17.0)
LYMPHS PCT: 18 %
Lymphs Abs: 1.2 10*3/uL (ref 0.7–4.0)
MCH: 36.3 pg — ABNORMAL HIGH (ref 26.0–34.0)
MCHC: 36.7 g/dL — ABNORMAL HIGH (ref 30.0–36.0)
MCV: 99 fL (ref 78.0–100.0)
MONO ABS: 0.8 10*3/uL (ref 0.1–1.0)
Monocytes Relative: 11 %
Neutro Abs: 4.8 10*3/uL (ref 1.7–7.7)
Neutrophils Relative %: 69 %
PLATELETS: 279 10*3/uL (ref 150–400)
RBC: 3.14 MIL/uL — AB (ref 4.22–5.81)
RDW: 12.2 % (ref 11.5–15.5)
WBC: 7 10*3/uL (ref 4.0–10.5)

## 2016-05-26 LAB — OSMOLALITY, URINE: OSMOLALITY UR: 351 mosm/kg (ref 300–900)

## 2016-05-26 LAB — MRSA PCR SCREENING: MRSA by PCR: NEGATIVE

## 2016-05-26 LAB — GLUCOSE, CAPILLARY
Glucose-Capillary: 101 mg/dL — ABNORMAL HIGH (ref 65–99)
Glucose-Capillary: 93 mg/dL (ref 65–99)
Glucose-Capillary: 99 mg/dL (ref 65–99)

## 2016-05-26 LAB — CORTISOL: CORTISOL PLASMA: 17.2 ug/dL

## 2016-05-26 LAB — TROPONIN I: Troponin I: <0.03 ng/mL (ref <0.03)

## 2016-05-26 LAB — TSH: TSH: 1.009 u[IU]/mL (ref 0.350–4.500)

## 2016-05-26 LAB — PROTIME-INR
INR: 1.04
PROTHROMBIN TIME: 13.6 s (ref 11.4–15.2)

## 2016-05-26 MED ORDER — IOPAMIDOL (ISOVUE-300) INJECTION 61%
100.0000 mL | Freq: Once | INTRAVENOUS | Status: AC | PRN
Start: 2016-05-26 — End: 2016-05-26
  Administered 2016-05-26: 100 mL via INTRAVENOUS

## 2016-05-26 MED ORDER — LORAZEPAM 1 MG PO TABS: 1.0000 mg | ORAL_TABLET | Freq: Four times a day (QID) | ORAL | Status: DC | PRN

## 2016-05-26 MED ORDER — LORAZEPAM 2 MG/ML IJ SOLN: 1.0000 mg | Freq: Four times a day (QID) | INTRAMUSCULAR | Status: DC | PRN

## 2016-05-26 MED ORDER — MAGNESIUM SULFATE 2 GM/50ML IV SOLN
2.0000 g | Freq: Once | INTRAVENOUS | Status: AC
  Administered 2016-05-26: 2 g via INTRAVENOUS
  Filled 2016-05-26: qty 50

## 2016-05-26 MED ORDER — VITAMIN B-1 100 MG PO TABS
100.0000 mg | ORAL_TABLET | Freq: Every day | ORAL | Status: DC
  Administered 2016-05-26 – 2016-05-28 (×3): 100 mg via ORAL
  Filled 2016-05-26 (×3): qty 1

## 2016-05-26 MED ORDER — MAGNESIUM SULFATE 50 % IJ SOLN: 2.0000 g | Freq: Once | INTRAMUSCULAR | Status: DC

## 2016-05-26 MED ORDER — POTASSIUM CHLORIDE CRYS ER 20 MEQ PO TBCR
40.0000 meq | EXTENDED_RELEASE_TABLET | ORAL | Status: AC
  Administered 2016-05-26 (×3): 40 meq via ORAL
  Filled 2016-05-26 (×3): qty 2

## 2016-05-26 MED ORDER — FOLIC ACID 1 MG PO TABS
1.0000 mg | ORAL_TABLET | Freq: Every day | ORAL | Status: DC
  Administered 2016-05-26 – 2016-05-28 (×3): 1 mg via ORAL
  Filled 2016-05-26 (×3): qty 1

## 2016-05-26 MED ORDER — ONDANSETRON HCL 4 MG PO TABS
4.0000 mg | ORAL_TABLET | Freq: Four times a day (QID) | ORAL | Status: DC | PRN
Start: 2016-05-26 — End: 2016-05-28

## 2016-05-26 MED ORDER — POTASSIUM CHLORIDE CRYS ER 20 MEQ PO TBCR
40.0000 meq | EXTENDED_RELEASE_TABLET | ORAL | Status: DC
Start: 2016-05-26 — End: 2016-05-26

## 2016-05-26 MED ORDER — ADULT MULTIVITAMIN W/MINERALS CH
1.0000 | ORAL_TABLET | Freq: Every day | ORAL | Status: DC
  Administered 2016-05-26 – 2016-05-28 (×3): 1 via ORAL
  Filled 2016-05-26 (×4): qty 1

## 2016-05-26 MED ORDER — SODIUM CHLORIDE 0.9 % IV SOLN
INTRAVENOUS | Status: DC
  Administered 2016-05-27: 11:00:00 via INTRAVENOUS

## 2016-05-26 MED ORDER — LORAZEPAM 2 MG/ML IJ SOLN
0.0000 mg | Freq: Four times a day (QID) | INTRAMUSCULAR | Status: AC
  Administered 2016-05-26: 1 mg via INTRAVENOUS
  Administered 2016-05-27: 2 mg via INTRAVENOUS
  Filled 2016-05-26 (×2): qty 1

## 2016-05-26 MED ORDER — LORAZEPAM 2 MG/ML IJ SOLN
1.0000 mg | Freq: Once | INTRAMUSCULAR | Status: AC
  Administered 2016-05-26: 1 mg via INTRAVENOUS
  Filled 2016-05-26: qty 1

## 2016-05-26 MED ORDER — THIAMINE HCL 100 MG/ML IJ SOLN
100.0000 mg | Freq: Every day | INTRAMUSCULAR | Status: DC
  Filled 2016-05-26: qty 2

## 2016-05-26 MED ORDER — ONDANSETRON HCL 4 MG/2ML IJ SOLN
4.0000 mg | Freq: Four times a day (QID) | INTRAMUSCULAR | Status: DC | PRN
  Administered 2016-05-26: 4 mg via INTRAVENOUS
  Filled 2016-05-26: qty 2

## 2016-05-26 MED ORDER — OXYCODONE-ACETAMINOPHEN 5-325 MG PO TABS
1.0000 | ORAL_TABLET | Freq: Four times a day (QID) | ORAL | Status: DC | PRN
  Administered 2016-05-26 – 2016-05-27 (×2): 1 via ORAL
  Filled 2016-05-26 (×2): qty 1

## 2016-05-26 MED ORDER — LORAZEPAM 2 MG/ML IJ SOLN: 0.0000 mg | Freq: Two times a day (BID) | INTRAMUSCULAR | Status: DC

## 2016-05-26 NOTE — H&P (Signed)
History and Physical    Jesse Sparks [id number] DOB: 12/04/1965 DOA: 05/25/2016  PCP: No primary care provider on file.  Patient coming from: Home.  Chief Complaint: Weakness.  HPI: Jesse Sparks is a 50 y.o. male with alcoholism and nonspecific arthritis presents to the ER because of increasing weakness with nausea vomiting and diarrhea over the last 1 week. Denies any abdominal pain chest pain or shortness of breath. In the ER patient had mild epigastric tenderness. Labs revealed severe hyponatremia hypokalemia and hypomagnesemia. Patient states he drinks alcohol every day. Denies any recent sick contacts or use of antibiotics. In addition labs reveal elevated LFT and lipase. Patient is being admitted for further management of his multiple electrolyte abnormalities and nausea vomiting and diarrhea.   ED Course: Patient was given 1.8 L of fluid bolus.  Review of Systems: As per HPI, rest all negative.   Past Medical History:  Diagnosis Date  . Hypertension   . Pancreatitis     Past Surgical History:  Procedure Laterality Date  . BILATERAL TEMPOROMANDIBULAR JOINT ARTHROPLASTY    . TRIGGER FINGER RELEASE       reports that he has been smoking.  He has never used smokeless tobacco. He reports that he drinks alcohol. He reports that he does not use drugs.  Allergies  Allergen Reactions  . Tramadol Rash    Gi upset     Family History  Problem Relation Age of Onset  . Melanoma Mother     Prior to Admission medications   Medication Sig Start Date End Date Taking? Authorizing Provider  meloxicam (MOBIC) 15 MG tablet Take 15 mg by mouth daily as needed for pain. with food 04/21/16  Yes Historical Provider, MD    Physical Exam: Vitals:   05/25/16 2223 05/25/16 2359 05/26/16 0030 05/26/16 0032  BP: 112/87 118/86 126/95 126/95  Pulse: 109 95 97 87  Resp: 18 11 15    Temp: 99.3 F (37.4 C)     TempSrc: Oral     SpO2: 100% 97% 98%   Weight: 165 lb 4 oz (75 kg)       Height: 5\' 8"  (1.727 m)         Constitutional: Not in distress. Vitals:   05/25/16 2223 05/25/16 2359 05/26/16 0030 05/26/16 0032  BP: 112/87 118/86 126/95 126/95  Pulse: 109 95 97 87  Resp: 18 11 15    Temp: 99.3 F (37.4 C)     TempSrc: Oral     SpO2: 100% 97% 98%   Weight: 165 lb 4 oz (75 kg)     Height: 5\' 8"  (1.727 m)      Eyes: Anicteric no pallor. ENMT: No discharge from the ears eyes nose and mouth. Neck: No mass felt. Respiratory: No rhonchi or crepitations. Cardiovascular: S1 and S2 heard. Abdomen: Soft mild epigastric discomfort no guarding or rigidity. Musculoskeletal: No edema. Skin: No rash. Neurologic: Alert awake oriented to time place and person. Moves all extremities. Psychiatric: Appears normal.   Labs on Admission: I have personally reviewed following labs and imaging studies  CBC:  Recent Labs Lab 05/25/16 2243  WBC 10.1  NEUTROABS 7.3  HGB 13.8  HCT 36.8*  MCV 98.4  PLT [id number]   Basic Metabolic Panel:  Recent Labs Lab 05/25/16 2243  NA 121*  K 2.1*  CL 69*  CO2 32  GLUCOSE 114*  BUN 19  CREATININE 0.93  CALCIUM 9.0  MG 1.3*   GFR: Estimated Creatinine Clearance: 91.9 mL/min (by C-G  formula based on SCr of 0.93 mg/dL). Liver Function Tests:  Recent Labs Lab 05/25/16 2243  AST 67*  ALT 83*  ALKPHOS 102  BILITOT 2.0*  PROT 8.4*  ALBUMIN 4.1    Recent Labs Lab 05/25/16 2243  LIPASE 371*   No results for input(s): AMMONIA in the last 168 hours. Coagulation Profile: No results for input(s): INR, PROTIME in the last 168 hours. Cardiac Enzymes: No results for input(s): CKTOTAL, CKMB, CKMBINDEX, TROPONINI in the last 168 hours. BNP (last 3 results) No results for input(s): PROBNP in the last 8760 hours. HbA1C: No results for input(s): HGBA1C in the last 72 hours. CBG: No results for input(s): GLUCAP in the last 168 hours. Lipid Profile: No results for input(s): CHOL, HDL, LDLCALC, TRIG, CHOLHDL, LDLDIRECT in the  last 72 hours. Thyroid Function Tests: No results for input(s): TSH, T4TOTAL, FREET4, T3FREE, THYROIDAB in the last 72 hours. Anemia Panel: No results for input(s): VITAMINB12, FOLATE, FERRITIN, TIBC, IRON, RETICCTPCT in the last 72 hours. Urine analysis:    Component Value Date/Time   COLORURINE AMBER (A) 05/25/2016 0032   APPEARANCEUR CLEAR 05/25/2016 0032   LABSPEC 1.015 05/25/2016 0032   PHURINE 6.0 05/25/2016 0032   GLUCOSEU NEGATIVE 05/25/2016 0032   HGBUR NEGATIVE 05/25/2016 0032   BILIRUBINUR MODERATE (A) 05/25/2016 0032   KETONESUR 15 (A) 05/25/2016 0032   PROTEINUR NEGATIVE 05/25/2016 0032   UROBILINOGEN 1.0 10/29/2012 0240   NITRITE NEGATIVE 05/25/2016 0032   LEUKOCYTESUR NEGATIVE 05/25/2016 0032   Sepsis Labs: @LABRCNTIP (procalcitonin:4,lacticidven:4) )No results found for this or any previous visit (from the past 240 hour(s)).   Radiological Exams on Admission: No results found.  EKG: Independently reviewed. Normal sinus rhythm with prolonged QTC of 507 ms.  Assessment/Plan Principal Problem:   Nausea, vomiting and diarrhea Active Problems:   Electrolyte imbalance   Hyponatremia   Alcohol abuse   Elevated LFTs   Acute pancreatitis    1. Nausea vomiting and diarrhea - could be gastroenteritis check stool studies. 2. Elevated LFTs with elevated lipase - probably secondary to alcoholic hepatitis. Since lipase is elevated check CT abdomen to rule out any pancreatic abnormalities or CBD obstruction. Closely follow LFTs. Check acute hepatitis panel. 3. Severe hyponatremia - multifactorial. Could be from dehydration given the low chloride levels. Could be also from alcoholism. Patient has received 1.8 L fluid in the ER. At this time we will hold off further fluids and closely follow metabolic panel urine studies and further plans based on the labs ordered. Check TSH and cortisol. 4. Alcohol abuse - patient has been placed on CIWA protocol. 5. Hypokalemia and  hypomagnesemia probably from diarrhea and vomiting - replace and recheck. 6. Prolonged QT interval - recheck EKG after correction of electrolytes.   DVT prophylaxis: SCDs for now and can be changed to Lovenox if there is no worsening of LFTs. Code Status: Full code.  Family Communication: Patient's wife.  Disposition Plan: Home.  Consults called: None.  Admission status: Observation. Stepdown.    Rise Patience MD [facility] Pager [phone].  If 7PM-7AM, please contact night-coverage [email or web address] Password [email or web address]  05/26/2016, 2:07 AM

## 2016-05-26 NOTE — ED Notes (Signed)
Requested urine sample from patient. Patient stated he was unable to provide one at this time.

## 2016-05-26 NOTE — ED Notes (Signed)
No respiratory or acute distress noted alert and oriented x 3 family at bedside call light in reach no reaction to medication noted. 

## 2016-05-26 NOTE — Progress Notes (Signed)
Patient seen and examined this morning, admitted overnight by Dr. Hal Hope, in brief 50 year old gentleman with ongoing alcohol abuse came in with nausea vomiting and diarrhea for the past week.  Nausea vomiting and diarrhea - could be gastroenteritis check stool studies.  Elevated LFTs with elevated lipase - probably secondary to alcoholic hepatitis.  -CT abdomen and pelvis without any acute pathology   Severe hyponatremia - multifactorial. Could be from dehydration given the low chloride levels. Could be also from alcoholism. Patient has received 1.8 L fluid in the ER.  - Continue IV fluids, sodium improved to 125 - Check TSH and cortisol  Alcohol abuse - patient has been placed on CIWA protocol.  Hypokalemia and hypomagnesemia probably from diarrhea and vomiting - replace and recheck.  Prolonged QT interval - recheck EKG after correction of electrolytes.  Costin M. Cruzita Lederer, MD [facility] [phone]

## 2016-05-26 NOTE — Progress Notes (Signed)
Initial Nutrition Assessment  DOCUMENTATION CODES:   Severe malnutrition in context of acute illness/injury  INTERVENTION:  -Ensure Enlive po BID, each supplement provides 350 kcal and 20 grams of protein with diet advancement -RD to continue to monitor  NUTRITION DIAGNOSIS:   Malnutrition related to acute illness as evidenced by energy intake < or equal to 50% for > or equal to 5 days, percent weight loss.  GOAL:   Patient will meet greater than or equal to 90% of their needs  MONITOR:   PO intake, I & O's, Supplement acceptance, Labs, Weight trends  REASON FOR ASSESSMENT:   Malnutrition Screening Tool    ASSESSMENT:   Jesse Sparks is a 50 y.o. male with alcoholism and nonspecific arthritis presents to the ER because of increasing weakness with nausea vomiting and diarrhea over the last 1 week. Denies any abdominal pain chest pain or shortness of breath. In the ER patient had mild epigastric tenderness. Labs revealed severe hyponatremia hypokalemia and hypomagnesemia.  Spoke with pt & pt's wife at bedside. Wife states she went away for 3 weeks, came back and patient was sick. Pt endorses n/v x1 week with no intakes, 20# wt loss over 3 weeks during that time span. Stated wt loss would indicate 20#/10.5% severe wt loss over 3 weeks. Per chart review, exhibits a 21#/11% severe wt loss over 6 months. Continues to be NPO. Vomited last night, no vomiting today, nausea under control with PRN zofran. Pt also has hx of ETOH abuse.  Nutrition-Focused physical exam completed. Findings are no fat depletion, no muscle depletion, and no edema.   Labs and medications reviewed: Banana Bag, KCL 22mEq Q2H, NS @ 59mL/hr Na 125, K 2.5, Tot Bilirubin 1.3  Diet Order:  Diet NPO time specified  Skin:  Reviewed, no issues  Last BM:  7/30  Height:   Ht Readings from Last 1 Encounters:  05/26/16 5\' 7"  (1.702 m)    Weight:   Wt Readings from Last 1 Encounters:  05/26/16 169 lb 8.5  oz (76.9 kg)    Ideal Body Weight:  67.27 kg  BMI:  Body mass index is 26.55 kg/m.  Estimated Nutritional Needs:   Kcal:  1550-1900 calories  Protein:  75-90 grams  Fluid:  >/= 1.55L  EDUCATION NEEDS:   No education needs identified at this time  Satira Anis. Hien Perreira, MS, RD LDN Inpatient Clinical Dietitian Pager [phone]

## 2016-05-27 DIAGNOSIS — R112 Nausea with vomiting, unspecified: Secondary | ICD-10-CM | POA: Diagnosis not present

## 2016-05-27 DIAGNOSIS — E876 Hypokalemia: Secondary | ICD-10-CM | POA: Diagnosis not present

## 2016-05-27 DIAGNOSIS — E871 Hypo-osmolality and hyponatremia: Secondary | ICD-10-CM | POA: Diagnosis not present

## 2016-05-27 DIAGNOSIS — R197 Diarrhea, unspecified: Secondary | ICD-10-CM | POA: Diagnosis not present

## 2016-05-27 LAB — GASTROINTESTINAL PANEL BY PCR, STOOL (REPLACES STOOL CULTURE)

## 2016-05-27 LAB — CLOSTRIDIUM DIFFICILE BY PCR: Toxigenic C. Difficile by PCR: POSITIVE — AB

## 2016-05-27 LAB — LIPASE, BLOOD: Lipase: 309 U/L — ABNORMAL HIGH (ref 11–51)

## 2016-05-27 LAB — C DIFFICILE QUICK SCREEN W PCR REFLEX
C DIFFICILE (CDIFF) TOXIN: NEGATIVE
C DIFFICLE (CDIFF) ANTIGEN: POSITIVE — AB

## 2016-05-27 LAB — PHOSPHORUS: Phosphorus: 1.3 mg/dL — ABNORMAL LOW (ref 2.5–4.6)

## 2016-05-27 LAB — CBC
HCT: 32.4 % — ABNORMAL LOW (ref 39.0–52.0)
Hemoglobin: 11.3 g/dL — ABNORMAL LOW (ref 13.0–17.0)
MCH: 35.5 pg — AB (ref 26.0–34.0)
MCHC: 34.9 g/dL (ref 30.0–36.0)
MCV: 101.9 fL — AB (ref 78.0–100.0)
PLATELETS: 259 10*3/uL (ref 150–400)
RBC: 3.18 MIL/uL — ABNORMAL LOW (ref 4.22–5.81)
RDW: 12.2 % (ref 11.5–15.5)
WBC: 4.3 10*3/uL (ref 4.0–10.5)

## 2016-05-27 LAB — COMPREHENSIVE METABOLIC PANEL
ALBUMIN: 3 g/dL — AB (ref 3.5–5.0)
ALK PHOS: 70 U/L (ref 38–126)
ALT: 71 U/L — AB (ref 17–63)
AST: 63 U/L — AB (ref 15–41)
Anion gap: 7 (ref 5–15)
BILIRUBIN TOTAL: 0.8 mg/dL (ref 0.3–1.2)
BUN: 11 mg/dL (ref 6–20)
CALCIUM: 8.2 mg/dL — AB (ref 8.9–10.3)
CO2: 31 mmol/L (ref 22–32)
CREATININE: 0.75 mg/dL (ref 0.61–1.24)
Chloride: 91 mmol/L — ABNORMAL LOW (ref 101–111)
GFR calc Af Amer: >60 mL/min (ref >60)
GFR calc non Af Amer: >60 mL/min (ref >60)
GLUCOSE: 128 mg/dL — AB (ref 65–99)
Potassium: 2.9 mmol/L — ABNORMAL LOW (ref 3.5–5.1)
Sodium: 129 mmol/L — ABNORMAL LOW (ref 135–145)
TOTAL PROTEIN: 6.3 g/dL — AB (ref 6.5–8.1)

## 2016-05-27 LAB — GLUCOSE, CAPILLARY
GLUCOSE-CAPILLARY: 127 mg/dL — AB (ref 65–99)
GLUCOSE-CAPILLARY: 129 mg/dL — AB (ref 65–99)
Glucose-Capillary: 108 mg/dL — ABNORMAL HIGH (ref 65–99)
Glucose-Capillary: 129 mg/dL — ABNORMAL HIGH (ref 65–99)

## 2016-05-27 LAB — HEPATITIS PANEL, ACUTE
HCV Ab: <0.1 s/co ratio (ref 0.0–0.9)
HEP B C IGM: NEGATIVE
Hep A IgM: NEGATIVE
Hepatitis B Surface Ag: NEGATIVE

## 2016-05-27 LAB — MAGNESIUM: Magnesium: 1.4 mg/dL — ABNORMAL LOW (ref 1.7–2.4)

## 2016-05-27 MED ORDER — MAGNESIUM SULFATE 2 GM/50ML IV SOLN
2.0000 g | Freq: Once | INTRAVENOUS | Status: AC
  Administered 2016-05-27: 2 g via INTRAVENOUS
  Filled 2016-05-27: qty 50

## 2016-05-27 MED ORDER — ENSURE ENLIVE PO LIQD
237.0000 mL | Freq: Two times a day (BID) | ORAL | Status: DC
  Administered 2016-05-28: 237 mL via ORAL

## 2016-05-27 MED ORDER — POTASSIUM & SODIUM PHOSPHATES 280-160-250 MG PO PACK
1.0000 | PACK | Freq: Three times a day (TID) | ORAL | Status: AC
  Administered 2016-05-27 (×3): 1 via ORAL
  Filled 2016-05-27 (×3): qty 1

## 2016-05-27 MED ORDER — POTASSIUM CHLORIDE CRYS ER 20 MEQ PO TBCR
40.0000 meq | EXTENDED_RELEASE_TABLET | Freq: Once | ORAL | Status: AC
Start: 2016-05-27 — End: 2016-05-27
  Administered 2016-05-27: 40 meq via ORAL
  Filled 2016-05-27: qty 2

## 2016-05-27 NOTE — Progress Notes (Signed)
PROGRESS NOTE  Jesse Sparks [id number] DOB: Aug 15, 1966 DOA: 05/25/2016 PCP: No primary care provider on file.   LOS: 0 days   Brief Narrative: 50 y.o. male with alcoholism and nonspecific arthritis presents to the ER because of increasing weakness with nausea vomiting and diarrhea over the last 1 week, admitted with mild acute pancreatitis  Assessment & Plan: Principal Problem:   Nausea, vomiting and diarrhea Active Problems:   Electrolyte imbalance   Hyponatremia   Alcohol abuse   Elevated LFTs   Acute pancreatitis   Nausea vomiting and diarrhea - Nausea and vomiting likely related to acute pancreatitis, however this wouldn't explain his diarrhea, C. Difficile and GI pathogen panel ordered, pending  Elevated LFTs with elevated lipase - probably secondary to alcoholic hepatitis.  - CT abdomen and pelvis without any acute pathology  - Lipase improving, clinically is improving, decreased pain, no further nausea and he is anxious to eat, will advance diet today  Severe hyponatremia - multifactorial. Could be from dehydration given the low chloride levels. Could be also from alcoholism. Patient has received 1.8 L fluid in the ER.  - Continue IV fluids, sodium improved to 129 this morning - TSH normal at 1.0, cortisol appropriate at 17   Alcohol abuse- patient has been placed on CIWA protocol.  Hypokalemia and hypomagnesemia - probably from diarrhea and vomiting - replace and recheck. - Potassium still low at 2.9 this morning, replete, phosphorus and magnesium need repletion as well  Prolonged QT interval - recheck EKG after correction of electrolytes.   DVT prophylaxis: SCD Code Status: Full code Family Communication: No family bedside  Disposition Plan: Transferred to telemetry, home when electrolytes are stable   Consultants:   None   Procedures:   None   Antimicrobials:  None    Subjective: - no chest pain, shortness of breath, no abdominal  pain, nausea or vomiting.  - He is hungry   Objective: Vitals:   05/27/16 0354 05/27/16 0400 05/27/16 0414 05/27/16 0759  BP:   92/69   Pulse:  83 88   Resp:  16 (!) 25   Temp: 98.3 F (36.8 C)   98.4 F (36.9 C)  TempSrc: Oral   Oral  SpO2:  98% 100%   Weight:   79 kg (174 lb 2.6 oz)   Height:        Intake/Output Summary (Last 24 hours) at 05/27/16 0932 Last data filed at 05/27/16 0857  Gross per 24 hour  Intake          3481.25 ml  Output             2475 ml  Net          1006.25 ml   Filed Weights   05/25/16 2223 05/26/16 0230 05/27/16 0414  Weight: 75 kg (165 lb 4 oz) 76.9 kg (169 lb 8.5 oz) 79 kg (174 lb 2.6 oz)    Examination: Constitutional: NAD Vitals:   05/27/16 0354 05/27/16 0400 05/27/16 0414 05/27/16 0759  BP:   92/69   Pulse:  83 88   Resp:  16 (!) 25   Temp: 98.3 F (36.8 C)   98.4 F (36.9 C)  TempSrc: Oral   Oral  SpO2:  98% 100%   Weight:   79 kg (174 lb 2.6 oz)   Height:       Eyes: PERRL, lids and conjunctivae normal Respiratory: clear to auscultation bilaterally, no wheezing, no crackles. Normal respiratory effort. No accessory muscle use.  Cardiovascular: Regular rate and rhythm, no murmurs / rubs / gallops. No LE edema. 2+ pedal pulses. Abdomen: no tenderness. Bowel sounds positive.  Musculoskeletal: no clubbing / cyanosis. No joint deformity upper and lower extremities. No contractures. Normal muscle tone.  Neurologic:  nonfocal, no asterixis  Psychiatric: Normal judgment and insight. Alert and oriented x 3. Normal mood.    Data Reviewed: I have personally reviewed following labs and imaging studies  CBC:  Recent Labs Lab 05/25/16 2243 05/26/16 0349 05/27/16 0321  WBC 10.1 7.0 4.3  NEUTROABS 7.3 4.8  --   HGB 13.8 11.4* 11.3*  HCT 36.8* 31.1* 32.4*  MCV 98.4 99.0 101.9*  PLT 332 279 [id number]   Basic Metabolic Panel:  Recent Labs Lab 05/25/16 2243 05/26/16 0349 05/26/16 0654 05/26/16 1033 05/26/16 1403 05/26/16 1850  05/27/16 0321  NA 121*  --  125* 125* 128* 128* 129*  K 2.1*  --  2.6* 2.5* 3.1* 3.2* 2.9*  CL 69*  --  81* 81* 84* 86* 91*  CO2 32  --  31 32 32 32 31  GLUCOSE 114*  --  88 91 85 100* 128*  BUN 19  --  14 14 13 13 11   CREATININE 0.93  --  0.69 0.79 0.85 0.78 0.75  CALCIUM 9.0  --  7.9* 8.2* 8.2* 8.6* 8.2*  MG 1.3* 1.9  --   --   --   --  1.4*  PHOS  --   --   --   --   --   --  1.3*   GFR: Estimated Creatinine Clearance: 103.3 mL/min (by C-G formula based on SCr of 0.8 mg/dL). Liver Function Tests:  Recent Labs Lab 05/25/16 2243 05/26/16 0654 05/27/16 0321  AST 67* 40 63*  ALT 83* 60 71*  ALKPHOS 102 77 70  BILITOT 2.0* 1.3* 0.8  PROT 8.4* 6.8 6.3*  ALBUMIN 4.1 3.3* 3.0*    Recent Labs Lab 05/25/16 2243 05/27/16 0321  LIPASE 371* 309*   No results for input(s): AMMONIA in the last 168 hours. Coagulation Profile:  Recent Labs Lab 05/26/16 0349  INR 1.04   Cardiac Enzymes:  Recent Labs Lab 05/26/16 0654  CKTOTAL 27*  TROPONINI <0.03   BNP (last 3 results) No results for input(s): PROBNP in the last 8760 hours. HbA1C: No results for input(s): HGBA1C in the last 72 hours. CBG:  Recent Labs Lab 05/26/16 0616 05/26/16 1222 05/26/16 1858 05/26/16 2334  GLUCAP 101* 93 99 129*   Lipid Profile: No results for input(s): CHOL, HDL, LDLCALC, TRIG, CHOLHDL, LDLDIRECT in the last 72 hours. Thyroid Function Tests:  Recent Labs  05/26/16 0704  TSH 1.009   Anemia Panel: No results for input(s): VITAMINB12, FOLATE, FERRITIN, TIBC, IRON, RETICCTPCT in the last 72 hours. Urine analysis:    Component Value Date/Time   COLORURINE AMBER (A) 05/25/2016 0032   APPEARANCEUR CLEAR 05/25/2016 0032   LABSPEC 1.015 05/25/2016 0032   PHURINE 6.0 05/25/2016 0032   GLUCOSEU NEGATIVE 05/25/2016 0032   HGBUR NEGATIVE 05/25/2016 0032   BILIRUBINUR MODERATE (A) 05/25/2016 0032   KETONESUR 15 (A) 05/25/2016 0032   PROTEINUR NEGATIVE 05/25/2016 0032   UROBILINOGEN  1.0 10/29/2012 0240   NITRITE NEGATIVE 05/25/2016 0032   LEUKOCYTESUR NEGATIVE 05/25/2016 0032   Sepsis Labs: Invalid input(s): PROCALCITONIN, LACTICIDVEN  Recent Results (from the past 240 hour(s))  MRSA PCR Screening     Status: None   Collection Time: 05/26/16  2:52 AM  Result Value Ref Range  Status   MRSA by PCR NEGATIVE NEGATIVE Final    Comment:        The GeneXpert MRSA Assay (FDA approved for NASAL specimens only), is one component of a comprehensive MRSA colonization surveillance program. It is not intended to diagnose MRSA infection nor to guide or monitor treatment for MRSA infections.       Radiology Studies: Ct Abdomen Pelvis W Contrast  Result Date: 05/26/2016 CLINICAL DATA:  50 year old male with elevated LFTs, nausea and vomiting. EXAM: CT ABDOMEN AND PELVIS WITH CONTRAST TECHNIQUE: Multidetector CT imaging of the abdomen and pelvis was performed using the standard protocol following bolus administration of intravenous contrast. CONTRAST:  180mL ISOVUE-300 IOPAMIDOL (ISOVUE-300) INJECTION 61% COMPARISON:  Abdominal radiograph dated 10/29/2012 FINDINGS: The visualized lung bases are clear. There is coronary vascular calcification. No intra-abdominal free air or free fluid. Apparent diffuse fatty infiltration of the liver. The gallbladder, pancreas, spleen, adrenal glands, kidneys, visualized ureters, and urinary bladder appear unremarkable. The prostate and seminal vesicles are grossly unremarkable. There is no evidence of bowel obstruction or active inflammation. Normal appendix. There is moderate aortoiliac atherosclerotic disease. The origins of the celiac axis, SMA, IMA as well as the origins of the renal arteries are patent. No portal venous gas identified. There is no adenopathy. The abdominal wall soft tissues appear unremarkable. There is degenerative changes of the spine. No acute fracture. IMPRESSION: No acute intra-abdominal pelvic pathology. Fatty liver.  Electronically Signed   By: Anner Crete M.D.   On: 05/26/2016 02:14    Scheduled Meds: . feeding supplement (ENSURE ENLIVE)  237 mL Oral BID BM  . folic acid  1 mg Oral Daily  . LORazepam  0-4 mg Intravenous Q6H   Followed by  . [START ON 05/28/2016] LORazepam  0-4 mg Intravenous Q12H  . magnesium sulfate 1 - 4 g bolus IVPB  2 g Intravenous Once  . multivitamin with minerals  1 tablet Oral Daily  . potassium & sodium phosphates  1 packet Oral TID WC & HS  . thiamine  100 mg Oral Daily   Or  . thiamine  100 mg Intravenous Daily   Continuous Infusions: . sodium chloride 75 mL/hr at 05/26/16 0847     Marzetta Board, MD, PhD [facility] Pager [phone]  If 7PM-7AM, please contact night-coverage [email or web address] Password [facility] 05/27/2016, 9:32 AM

## 2016-05-27 NOTE — Progress Notes (Signed)
Received pt from ICU, telemetry applied, VS obtained, oriented to unit, call light placed in reach

## 2016-05-27 NOTE — Care Management Note (Signed)
Case Management Note  Patient Details  Name: Jesse Sparks MRN: [id number] Date of Birth: 1966/06/24  Subjective/Objective: 50 y/o m admitted w/Acute pancreatitis. From home.                   Action/Plan:d/c plan home.   Expected Discharge Date:                  Expected Discharge Plan:  Home/Self Care  In-House Referral:     Discharge planning Services     Post Acute Care Choice:    Choice offered to:     DME Arranged:    DME Agency:     HH Arranged:    HH Agency:     Status of Service:  In process, will continue to follow  If discussed at Long Length of Stay Meetings, dates discussed:    Additional Comments:  Dessa Phi, RN 05/27/2016, 1:36 PM

## 2016-05-28 DIAGNOSIS — R197 Diarrhea, unspecified: Secondary | ICD-10-CM

## 2016-05-28 DIAGNOSIS — K852 Alcohol induced acute pancreatitis without necrosis or infection: Secondary | ICD-10-CM

## 2016-05-28 DIAGNOSIS — F1010 Alcohol abuse, uncomplicated: Secondary | ICD-10-CM | POA: Diagnosis not present

## 2016-05-28 DIAGNOSIS — R7989 Other specified abnormal findings of blood chemistry: Secondary | ICD-10-CM

## 2016-05-28 DIAGNOSIS — R112 Nausea with vomiting, unspecified: Secondary | ICD-10-CM

## 2016-05-28 DIAGNOSIS — E878 Other disorders of electrolyte and fluid balance, not elsewhere classified: Secondary | ICD-10-CM

## 2016-05-28 DIAGNOSIS — A047 Enterocolitis due to Clostridium difficile: Secondary | ICD-10-CM

## 2016-05-28 DIAGNOSIS — E871 Hypo-osmolality and hyponatremia: Secondary | ICD-10-CM

## 2016-05-28 DIAGNOSIS — A0472 Enterocolitis due to Clostridium difficile, not specified as recurrent: Secondary | ICD-10-CM | POA: Diagnosis present

## 2016-05-28 LAB — COMPREHENSIVE METABOLIC PANEL
ALBUMIN: 3 g/dL — AB (ref 3.5–5.0)
ALT: 157 U/L — ABNORMAL HIGH (ref 17–63)
ANION GAP: 8 (ref 5–15)
AST: 132 U/L — ABNORMAL HIGH (ref 15–41)
Alkaline Phosphatase: 75 U/L (ref 38–126)
BUN: 10 mg/dL (ref 6–20)
CALCIUM: 8.1 mg/dL — AB (ref 8.9–10.3)
CO2: 27 mmol/L (ref 22–32)
Chloride: 99 mmol/L — ABNORMAL LOW (ref 101–111)
Creatinine, Ser: 0.73 mg/dL (ref 0.61–1.24)
GFR calc non Af Amer: >60 mL/min (ref >60)
GLUCOSE: 95 mg/dL (ref 65–99)
POTASSIUM: 3.4 mmol/L — AB (ref 3.5–5.1)
SODIUM: 134 mmol/L — AB (ref 135–145)
TOTAL PROTEIN: 6.5 g/dL (ref 6.5–8.1)
Total Bilirubin: 0.3 mg/dL (ref 0.3–1.2)

## 2016-05-28 LAB — GLUCOSE, CAPILLARY: GLUCOSE-CAPILLARY: 107 mg/dL — AB (ref 65–99)

## 2016-05-28 LAB — CBC
HEMATOCRIT: 33.2 % — AB (ref 39.0–52.0)
HEMOGLOBIN: 11.8 g/dL — AB (ref 13.0–17.0)
MCH: 36.6 pg — ABNORMAL HIGH (ref 26.0–34.0)
MCHC: 35.5 g/dL (ref 30.0–36.0)
MCV: 103.1 fL — ABNORMAL HIGH (ref 78.0–100.0)
Platelets: 283 10*3/uL (ref 150–400)
RBC: 3.22 MIL/uL — AB (ref 4.22–5.81)
RDW: 12.4 % (ref 11.5–15.5)
WBC: 5.9 10*3/uL (ref 4.0–10.5)

## 2016-05-28 MED ORDER — ENSURE ENLIVE PO LIQD: 237.0000 mL | Freq: Two times a day (BID) | ORAL | 2 refills | Status: AC

## 2016-05-28 MED ORDER — THIAMINE HCL 100 MG PO TABS: 100.0000 mg | ORAL_TABLET | Freq: Every day | ORAL | 0 refills | Status: AC

## 2016-05-28 MED ORDER — FOLIC ACID 1 MG PO TABS: 1.0000 mg | ORAL_TABLET | Freq: Every day | ORAL | 0 refills | Status: AC

## 2016-05-28 MED ORDER — POTASSIUM CHLORIDE CRYS ER 20 MEQ PO TBCR
40.0000 meq | EXTENDED_RELEASE_TABLET | Freq: Once | ORAL | Status: AC
  Administered 2016-05-28: 40 meq via ORAL
  Filled 2016-05-28: qty 2

## 2016-05-28 MED ORDER — METRONIDAZOLE 500 MG PO TABS
500.0000 mg | ORAL_TABLET | Freq: Three times a day (TID) | ORAL | 0 refills | Status: AC
Start: 2016-05-28 — End: 2016-06-07

## 2016-05-28 NOTE — Discharge Instructions (Signed)
Alcohol Use Disorder Alcohol use disorder is a mental disorder. It is not a one-time incident of heavy drinking. Alcohol use disorder is the excessive and uncontrollable use of alcohol over time that leads to problems with functioning in one or more areas of daily living. People with this disorder risk harming themselves and others when they drink to excess. Alcohol use disorder also can cause other mental disorders, such as mood and anxiety disorders, and serious physical problems. People with alcohol use disorder often misuse other drugs.  Alcohol use disorder is common and widespread. Some people with this disorder drink alcohol to cope with or escape from negative life events. Others drink to relieve chronic pain or symptoms of mental illness. People with a family history of alcohol use disorder are at higher risk of losing control and using alcohol to excess.  Drinking too much alcohol can cause injury, accidents, and health problems. One drink can be too much when you are:  Working.  Pregnant or breastfeeding.  Taking medicines. Ask your doctor.  Driving or planning to drive. SYMPTOMS  Signs and symptoms of alcohol use disorder may include the following:   Consumption ofalcohol inlarger amounts or over a longer period of time than intended.  Multiple unsuccessful attempts to cutdown or control alcohol use.   A great deal of time spent obtaining alcohol, using alcohol, or recovering from the effects of alcohol (hangover).  A strong desire or urge to use alcohol (cravings).   Continued use of alcohol despite problems at work, school, or home because of alcohol use.   Continued use of alcohol despite problems in relationships because of alcohol use.  Continued use of alcohol in situations when it is physically hazardous, such as driving a car.  Continued use of alcohol despite awareness of a physical or psychological problem that is likely related to alcohol use. Physical  problems related to alcohol use can involve the brain, heart, liver, stomach, and intestines. Psychological problems related to alcohol use include intoxication, depression, anxiety, psychosis, delirium, and dementia.   The need for increased amounts of alcohol to achieve the same desired effect, or a decreased effect from the consumption of the same amount of alcohol (tolerance).  Withdrawal symptoms upon reducing or stopping alcohol use, or alcohol use to reduce or avoid withdrawal symptoms. Withdrawal symptoms include:  Racing heart.  Hand tremor.  Difficulty sleeping.  Nausea.  Vomiting.  Hallucinations.  Restlessness.  Seizures. DIAGNOSIS Alcohol use disorder is diagnosed through an assessment by your health care provider. Your health care provider may start by asking three or four questions to screen for excessive or problematic alcohol use. To confirm a diagnosis of alcohol use disorder, at least two symptoms must be present within a 66-month period. The severity of alcohol use disorder depends on the number of symptoms:  Mild--two or three.  Moderate--four or five.  Severe--six or more. Your health care provider may perform a physical exam or use results from lab tests to see if you have physical problems resulting from alcohol use. Your health care provider may refer you to a mental health professional for evaluation. TREATMENT  Some people with alcohol use disorder are able to reduce their alcohol use to low-risk levels. Some people with alcohol use disorder need to quit drinking alcohol. When necessary, mental health professionals with specialized training in substance use treatment can help. Your health care provider can help you decide how severe your alcohol use disorder is and what type of treatment you need.  The following forms of treatment are available:   Detoxification. Detoxification involves the use of prescription medicines to prevent alcohol withdrawal  symptoms in the first week after quitting. This is important for people with a history of symptoms of withdrawal and for heavy drinkers who are likely to have withdrawal symptoms. Alcohol withdrawal can be dangerous and, in severe cases, cause death. Detoxification is usually provided in a hospital or in-patient substance use treatment facility.  Counseling or talk therapy. Talk therapy is provided by substance use treatment counselors. It addresses the reasons people use alcohol and ways to keep them from drinking again. The goals of talk therapy are to help people with alcohol use disorder find healthy activities and ways to cope with life stress, to identify and avoid triggers for alcohol use, and to handle cravings, which can cause relapse.  Medicines.Different medicines can help treat alcohol use disorder through the following actions:  Decrease alcohol cravings.  Decrease the positive reward response felt from alcohol use.  Produce an uncomfortable physical reaction when alcohol is used (aversion therapy).  Support groups. Support groups are run by people who have quit drinking. They provide emotional support, advice, and guidance. These forms of treatment are often combined. Some people with alcohol use disorder benefit from intensive combination treatment provided by specialized substance use treatment centers. Both inpatient and outpatient treatment programs are available.   This information is not intended to replace advice given to you by your health care provider. Make sure you discuss any questions you have with your health care provider.   Document Released: 11/20/2004 Document Revised: 11/03/2014 Document Reviewed: 01/20/2013 Elsevier Interactive Patient Education 2016 [place].  Diarrhea Diarrhea is watery poop (stool). It can make you feel weak, tired, thirsty, or give you a dry mouth (signs of dehydration). Watery poop is a sign of another problem, most often an  infection. It often lasts 2-3 days. It can last longer if it is a sign of something serious. Take care of yourself as told by your doctor. HOME CARE   Drink 1 cup (8 ounces) of fluid each time you have watery poop.  Do not drink the following fluids:  Those that contain simple sugars (fructose, glucose, galactose, lactose, sucrose, maltose).  Sports drinks.  Fruit juices.  Whole milk products.  Sodas.  Drinks with caffeine (coffee, tea, soda) or alcohol.  Oral rehydration solution may be used if the doctor says it is okay. You may make your own solution. Follow this recipe:   - teaspoon table salt.   teaspoon baking soda.   teaspoon salt substitute containing potassium chloride.  1 tablespoons sugar.  1 liter (34 ounces) of water.  Avoid the following foods:  High fiber foods, such as raw fruits and vegetables.  Nuts, seeds, and whole grain breads and cereals.   Those that are sweetened with sugar alcohols (xylitol, sorbitol, mannitol).  Try eating the following foods:  Starchy foods, such as rice, toast, pasta, low-sugar cereal, oatmeal, baked potatoes, crackers, and bagels.  Bananas.  Applesauce.  Eat probiotic-rich foods, such as yogurt and milk products that are fermented.  Wash your hands well after each time you have watery poop.  Only take medicine as told by your doctor.  Take a warm bath to help lessen burning or pain from having watery poop. GET HELP RIGHT AWAY IF:   You cannot drink fluids without throwing up (vomiting).  You keep throwing up.  You have blood in your poop, or your poop looks  black and tarry.  You do not pee (urinate) in 6-8 hours, or there is only a small amount of very dark pee.  You have belly (abdominal) pain that gets worse or stays in the same spot (localizes).  You are weak, dizzy, confused, or light-headed.  You have a very bad headache.  Your watery poop gets worse or does not get better.  You have a fever or  lasting symptoms for more than 2-3 days.  You have a fever and your symptoms suddenly get worse. MAKE SURE YOU:   Understand these instructions.  Will watch your condition.  Will get help right away if you are not doing well or get worse.   This information is not intended to replace advice given to you by your health care provider. Make sure you discuss any questions you have with your health care provider.   Document Released: 03/31/2008 Document Revised: 11/03/2014 Document Reviewed: 06/20/2012 Elsevier Interactive Patient Education 2016 [place] Choices to Help Relieve Diarrhea, Adult When you have diarrhea, the foods you eat and your eating habits are very important. Choosing the right foods and drinks can help relieve diarrhea. Also, because diarrhea can last up to 7 days, you need to replace lost fluids and electrolytes (such as sodium, potassium, and chloride) in order to help prevent dehydration.  WHAT GENERAL GUIDELINES DO I NEED TO FOLLOW?  Slowly drink 1 cup (8 oz) of fluid for each episode of diarrhea. If you are getting enough fluid, your urine will be clear or pale yellow.  Eat starchy foods. Some good choices include white rice, white toast, pasta, low-fiber cereal, baked potatoes (without the skin), saltine crackers, and bagels.  Avoid large servings of any cooked vegetables.  Limit fruit to two servings per day. A serving is  cup or 1 small piece.  Choose foods with less than 2 g of fiber per serving.  Limit fats to less than 8 tsp (38 g) per day.  Avoid fried foods.  Eat foods that have probiotics in them. Probiotics can be found in certain dairy products.  Avoid foods and beverages that may increase the speed at which food moves through the stomach and intestines (gastrointestinal tract). Things to avoid include:  High-fiber foods, such as dried fruit, raw fruits and vegetables, nuts, seeds, and whole grain foods.  Spicy foods and high-fat  foods.  Foods and beverages sweetened with high-fructose corn syrup, honey, or sugar alcohols such as xylitol, sorbitol, and mannitol. WHAT FOODS ARE RECOMMENDED? Grains White rice. White, [place], or pita breads (fresh or toasted), including plain rolls, buns, or bagels. White pasta. Saltine, soda, or graham crackers. Pretzels. Low-fiber cereal. Cooked cereals made with water (such as cornmeal, farina, or cream cereals). Plain muffins. Matzo. Melba toast. Zwieback.  Vegetables Potatoes (without the skin). Strained tomato and vegetable juices. Most well-cooked and canned vegetables without seeds. Tender lettuce. Fruits Cooked or canned applesauce, apricots, cherries, fruit cocktail, grapefruit, peaches, pears, or plums. Fresh bananas, apples without skin, cherries, grapes, cantaloupe, grapefruit, peaches, oranges, or plums.  Meat and Other Protein Products Baked or boiled chicken. Eggs. Tofu. Fish. Seafood. Smooth peanut butter. Ground or well-cooked tender beef, ham, veal, lamb, pork, or poultry.  Dairy Plain yogurt, kefir, and unsweetened liquid yogurt. Lactose-free milk, buttermilk, or soy milk. Plain hard cheese. Beverages Sport drinks. Clear broths. Diluted fruit juices (except prune). Regular, caffeine-free sodas such as ginger ale. Water. Decaffeinated teas. Oral rehydration solutions. Sugar-free beverages not sweetened with sugar alcohols.  Other Bouillon, broth, or soups made from recommended foods.  The items listed above may not be a complete list of recommended foods or beverages. Contact your dietitian for more options. WHAT FOODS ARE NOT RECOMMENDED? Grains Whole grain, whole wheat, bran, or rye breads, rolls, pastas, crackers, and cereals. Wild or brown rice. Cereals that contain more than 2 g of fiber per serving. Corn tortillas or taco shells. Cooked or dry oatmeal. Granola. Popcorn. Vegetables Raw vegetables. Cabbage, broccoli, Brussels sprouts, artichokes, baked beans, beet  greens, corn, kale, legumes, peas, sweet potatoes, and yams. Potato skins. Cooked spinach and cabbage. Fruits Dried fruit, including raisins and dates. Raw fruits. Stewed or dried prunes. Fresh apples with skin, apricots, mangoes, pears, raspberries, and strawberries.  Meat and Other Protein Products Chunky peanut butter. Nuts and seeds. Beans and lentils. Berniece Salines.  Dairy High-fat cheeses. Milk, chocolate milk, and beverages made with milk, such as milk shakes. Cream. Ice cream. Sweets and Desserts Sweet rolls, doughnuts, and sweet breads. Pancakes and waffles. Fats and Oils Butter. Cream sauces. Margarine. Salad oils. Plain salad dressings. Olives. Avocados.  Beverages Caffeinated beverages (such as coffee, tea, soda, or energy drinks). Alcoholic beverages. Fruit juices with pulp. Prune juice. Soft drinks sweetened with high-fructose corn syrup or sugar alcohols. Other Coconut. Hot sauce. Chili powder. Mayonnaise. Gravy. Cream-based or milk-based soups.  The items listed above may not be a complete list of foods and beverages to avoid. Contact your dietitian for more information. WHAT SHOULD I DO IF I BECOME DEHYDRATED? Diarrhea can sometimes lead to dehydration. Signs of dehydration include dark urine and dry mouth and skin. If you think you are dehydrated, you should rehydrate with an oral rehydration solution. These solutions can be purchased at pharmacies, retail stores, or online.  Drink -1 cup (120-240 mL) of oral rehydration solution each time you have an episode of diarrhea. If drinking this amount makes your diarrhea worse, try drinking smaller amounts more often. For example, drink 1-3 tsp (5-15 mL) every 5-10 minutes.  A general rule for staying hydrated is to drink 1-2 L of fluid per day. Talk to your health care provider about the specific amount you should be drinking each day. Drink enough fluids to keep your urine clear or pale yellow.   This information is not intended to  replace advice given to you by your health care provider. Make sure you discuss any questions you have with your health care provider.   Document Released: 01/03/2004 Document Revised: 11/03/2014 Document Reviewed: 09/05/2013 Elsevier Interactive Patient Education 2016 [employer].  Hypokalemia Hypokalemia means that the amount of potassium in the blood is lower than normal.Potassium is a chemical, called an electrolyte, that helps regulate the amount of fluid in the body. It also stimulates muscle contraction and helps nerves function properly.Most of the body's potassium is inside of cells, and only a very small amount is in the blood. Because the amount in the blood is so small, minor changes can be life-threatening. CAUSES  Antibiotics.  Diarrhea or vomiting.  Using laxatives too much, which can cause diarrhea.  Chronic kidney disease.  Water pills (diuretics).  Eating disorders (bulimia).  Low magnesium level.  Sweating a lot. SIGNS AND SYMPTOMS  Weakness.  Constipation.  Fatigue.  Muscle cramps.  Mental confusion.  Skipped heartbeats or irregular heartbeat (palpitations).  Tingling or numbness. DIAGNOSIS  Your health care provider can diagnose hypokalemia with blood tests. In addition to checking your potassium level, your health care provider may also check other  lab tests. TREATMENT Hypokalemia can be treated with potassium supplements taken by mouth or adjustments in your current medicines. If your potassium level is very low, you may need to get potassium through a vein (IV) and be monitored in the hospital. A diet high in potassium is also helpful. Foods high in potassium are:  Nuts, such as peanuts and pistachios.  Seeds, such as sunflower seeds and pumpkin seeds.  Peas, lentils, and lima beans.  Whole grain and bran cereals and breads.  Fresh fruit and vegetables, such as apricots, avocado, bananas, cantaloupe, kiwi, oranges, tomatoes, asparagus,  and potatoes.  Orange and tomato juices.  Red meats.  Fruit yogurt. HOME CARE INSTRUCTIONS  Take all medicines as prescribed by your health care provider.  Maintain a healthy diet by including nutritious food, such as fruits, vegetables, nuts, whole grains, and lean meats.  If you are taking a laxative, be sure to follow the directions on the label. SEEK MEDICAL CARE IF:  Your weakness gets worse.  You feel your heart pounding or racing.  You are vomiting or having diarrhea.  You are diabetic and having trouble keeping your blood glucose in the normal range. SEEK IMMEDIATE MEDICAL CARE IF:  You have chest pain, shortness of breath, or dizziness.  You are vomiting or having diarrhea for more than 2 days.  You faint. MAKE SURE YOU:   Understand these instructions.  Will watch your condition.  Will get help right away if you are not doing well or get worse.   This information is not intended to replace advice given to you by your health care provider. Make sure you discuss any questions you have with your health care provider.   Document Released: 10/13/2005 Document Revised: 11/03/2014 Document Reviewed: 04/15/2013 Elsevier Interactive Patient Education 2016 [place].  Liver Function Tests Liver function tests are blood tests to see how well your liver is working. The proteins and enzymes measured in the test can alert your health care provider to inflammation, damage, or disease in your liver. It is common to have liver function tests:  During annual physical exams.  When you are taking certain medicines.  If you have liver disease.  If you drink a lot of alcohol.  When you are not feeling well.  When you have other conditions that may affect the liver. Substances measured may include:   Alanine transaminase (ALT). This is an enzyme in the liver.  Aspartate transaminase (AST). This is an enzyme in the liver, heart, and muscles.  Alkaline phosphatase  (ALP). This is a protein in the liver, bile ducts, and bone. It is also in other body tissues.  Total bilirubin. This is a yellow pigment in bile.  Albumin. This is a protein in the liver.  Prothrombin time and international normalized ratio (PT and INR). PT measures the time that it takes for your blood to clot. INR is a calculation of blood clotting time based upon your PT result. It is also calculated based on normal ranges defined by the laboratory that processed your lab test.  Total protein. This measures two proteins, albumin and globulin, found in the blood. PREPARATION FOR TEST How you prepare will depend on which tests are being done and the reason why these tests are being done. You may need to:  Avoid eating for 4-6 hours before the test or as directed by your health care provider.  Stop taking certain medicines prior to your blood test as directed by your health care provider.  RESULTS  It is your responsibility to obtain your test results. Ask the lab or department performing the test when and how you will get your results. Contact your health care provider to discuss any questions you have about your results. RANGE OF NORMAL VALUES Ranges for normal values may vary among different labs and hospitals. You should always check with your health care provider after having lab work or other tests done to discuss the meaning of your test results and whether your values are considered within normal limits. The following are normal ranges for substances measured in liver function tests: ALT  Infant: may be twice as high as adult values.  Child or adult: 4-36 international units/L at 37C or 4-36 units/L (SI units).  Elderly: may be slightly higher than adult values. AST  Newborn 61-47 days old: 35-140 units/L.  Child under 31 years old: 15-60 units/L.  58-1 years old: 15-50 units/L.  98-8 years old: 10-50 units/L.  4-77 years old: 10-40 units/L.  Adult: 0-35 units/L or 0-0.58  microkatal/L (SI units).  Elderly: slightly higher than adults. ALP  Child under 39 years old: 85-235 units/L.  71-67 years old: 65-210 units/L.  70-8 years old: 60-300 units/L.  74-55 years old: 30-200 units/L.  Adult: 30-120 units/L or 0.5-2.0 microkatal/L (SI units).  Elderly: slightly higher than adult. Total bilirubin  Newborn: 1.0-12.0 mg/dL or 17.1-205 micromoles/L (SI units).  Adult, elderly, or child: 0.3-1.0 mg/dL or 5.1-17 micromoles/L. Albumin  Premature infant: 3.0-4.2 g/dL.  Newborn: 3.5-5.4 g/dL.  Infant: 4.4-5.4 g/dL.  Child: 4.0-5.9 g/dL.  Adult or elderly: 3.5-5.0 g/dL or 35-50 g/L (SI units). PT  11.0-12.5 seconds; 85%-100%. INR  0.8-1.1. Total protein  Premature infant: 4.2-7.6 g/dL.  Newborn: 4.6-7.4 g/dL.  Infant: 6.0-6.7 g/dL.  Child: 6.2-8.0 g/dL.  Adult or elderly: 6.4-8.3 g/dL or 64-83 g/L (SI units). MEANING OF RESULTS OUTSIDE NORMAL VALUE RANGES Sometimes test results can be abnormal due to other factors, such as medicines, exercise, or pregnancy. Follow up with your health care provider if you have any questions about test results outside the normal value ranges. ALT  Levels above the normal range, along with other test results, may indicate liver disease. AST  Levels above the normal range, along with other test results, may indicate liver disease. Sometimes levels also increase after burns, surgery, heart attack, muscle damage, or seizure. ALP  Levels above the normal range, along with other test results, may indicate biliary obstruction, diseases of the liver, bone disease, thyroid disease, tumors, fractures, leukemia or lymphoma, or several other conditions. People with blood type O or B may show higher levels after a fatty meal.  Levels below the normal range, along with other test results, may indicate bone and teeth conditions, malnutrition, protein deficiency, or Wilson disease. Total bilirubin  Levels above the normal  range, along with other test results, may indicate problems with the liver, gallbladder, or bile ducts. Albumin  Levels above the normal range, along with other test results, may indicate dehydration. They may also be caused by a diet that is high in protein. Sometimes, the band placed around the upper arm during the process of drawing blood can cause the level of this protein in your blood to rise and give you a result above the normal range.  Levels below the normal range, along with other tests results, may indicate kidney disease, liver disease, or malabsorption of nutrients. PT and INR  Levels above the normal range mean your blood is clotting slower than normal.  This may be due to blood disorders, liver disorders, or low levels of vitamin K. Total protein  Levels above the normal range, along with other test results, may be due to infection or other diseases.  Levels below the normal range, along with other test results, may be due to an immune system disorder, bleeding, burns, kidney disorder, liver disease, trouble absorbing or getting enough nutrients, or other conditions that affect the intestines.   This information is not intended to replace advice given to you by your health care provider. Make sure you discuss any questions you have with your health care provider.   Document Released: 11/15/2004 Document Revised: 11/03/2014 Document Reviewed: 02/16/2014 Elsevier Interactive Patient Education 2016 [employer]   Nausea and Vomiting Nausea means you feel sick to your stomach. Throwing up (vomiting) is a reflex where stomach contents come out of your mouth. HOME CARE   Take medicine as told by your doctor.  Do not force yourself to eat. However, you do need to drink fluids.  If you feel like eating, eat a normal diet as told by your doctor.  Eat rice, wheat, potatoes, bread, lean meats, yogurt, fruits, and vegetables.  Avoid high-fat foods.  Drink enough fluids to keep  your pee (urine) clear or pale yellow.  Ask your doctor how to replace body fluid losses (rehydrate). Signs of body fluid loss (dehydration) include:  Feeling very thirsty.  Dry lips and mouth.  Feeling dizzy.  Dark pee.  Peeing less than normal.  Feeling confused.  Fast breathing or heart rate. GET HELP RIGHT AWAY IF:   You have blood in your throw up.  You have black or bloody poop (stool).  You have a bad headache or stiff neck.  You feel confused.  You have bad belly (abdominal) pain.  You have chest pain or trouble breathing.  You do not pee at least once every 8 hours.  You have cold, clammy skin.  You keep throwing up after 24 to 48 hours.  You have a fever. MAKE SURE YOU:   Understand these instructions.  Will watch your condition.  Will get help right away if you are not doing well or get worse.   This information is not intended to replace advice given to you by your health care provider. Make sure you discuss any questions you have with your health care provider.   Document Released: 03/31/2008 Document Revised: 01/05/2012 Document Reviewed: 03/14/2011 [employer]vier Interactive Patient Education 2016 [employer].  Potassium Content of Foods Potassium is a mineral found in many foods and drinks. It helps keep fluids and minerals balanced in your body and affects how steadily your heart beats. Potassium also helps control your blood pressure and keep your muscles and nervous system healthy. Certain health conditions and medicines may change the balance of potassium in your body. When this happens, you can help balance your level of potassium through the foods that you do or do not eat. Your health care provider or dietitian may recommend an amount of potassium that you should have each day. The following lists of foods provide the amount of potassium (in parentheses) per serving in each item. HIGH IN POTASSIUM  The following foods and beverages have 200 mg  or more of potassium per serving:  Apricots, 2 raw or 5 dry (200 mg).  Artichoke, 1 medium (345 mg).  Avocado, raw,  each (245 mg).  Banana, 1 medium (425 mg).  Beans, lima, or baked beans, canned,  cup (280  mg).  Beans, white, canned,  cup (595 mg).  Beef roast, 3 oz (320 mg).  Beef, ground, 3 oz (270 mg).  Beets, raw or cooked,  cup (260 mg).  Bran muffin, 2 oz (300 mg).  Broccoli,  cup (230 mg).  Brussels sprouts,  cup (250 mg).  Cantaloupe,  cup (215 mg).  Cereal, 100% bran,  cup (200-400 mg).  Cheeseburger, single, fast food, 1 each (225-400 mg).  Chicken, 3 oz (220 mg).  Clams, canned, 3 oz (535 mg).  Crab, 3 oz (225 mg).  Dates, 5 each (270 mg).  Dried beans and peas,  cup (300-475 mg).  Figs, dried, 2 each (260 mg).  Fish: halibut, tuna, cod, snapper, 3 oz (480 mg).  Fish: salmon, haddock, swordfish, perch, 3 oz (300 mg).  Fish, tuna, canned 3 oz (200 mg).  [place] fries, fast food, 3 oz (470 mg).  Granola with fruit and nuts,  cup (200 mg).  Grapefruit juice,  cup (200 mg).  Greens, beet,  cup (655 mg).  Honeydew melon,  cup (200 mg).  Kale, raw, 1 cup (300 mg).  Kiwi, 1 medium (240 mg).  Kohlrabi, rutabaga, parsnips,  cup (280 mg).  Lentils,  cup (365 mg).  Mango, 1 each (325 mg).  Milk, chocolate, 1 cup (420 mg).  Milk: nonfat, low-fat, whole, buttermilk, 1 cup (350-380 mg).  Molasses, 1 Tbsp (295 mg).  Mushrooms,  cup (280) mg.  Nectarine, 1 each (275 mg).  Nuts: almonds, peanuts, hazelnuts, [place], cashew, mixed, 1 oz (200 mg).  Nuts, pistachios, 1 oz (295 mg).  Orange, 1 each (240 mg).  Orange juice,  cup (235 mg).  Papaya, medium,  fruit (390 mg).  Peanut butter, chunky, 2 Tbsp (240 mg).  Peanut butter, smooth, 2 Tbsp (210 mg).  Pear, 1 medium (200 mg).  Pomegranate, 1 whole (400 mg).  Pomegranate juice,  cup (215 mg).  Pork, 3 oz (350 mg).  Potato chips, salted, 1 oz (465  mg).  Potato, baked with skin, 1 medium (925 mg).  Potatoes, boiled,  cup (255 mg).  Potatoes, mashed,  cup (330 mg).  Prune juice,  cup (370 mg).  Prunes, 5 each (305 mg).  Pudding, chocolate,  cup (230 mg).  Pumpkin, canned,  cup (250 mg).  Raisins, seedless,  cup (270 mg).  Seeds, sunflower or pumpkin, 1 oz (240 mg).  Soy milk, 1 cup (300 mg).  Spinach,  cup (420 mg).  Spinach, canned,  cup (370 mg).  Sweet potato, baked with skin, 1 medium (450 mg).  Swiss chard,  cup (480 mg).  Tomato or vegetable juice,  cup (275 mg).  Tomato sauce or puree,  cup (400-550 mg).  Tomato, raw, 1 medium (290 mg).  Tomatoes, canned,  cup (200-300 mg).  [place], 3 oz (250 mg).  Wheat germ, 1 oz (250 mg).  Winter squash,  cup (250 mg).  Yogurt, plain or fruited, 6 oz (260-435 mg).  Zucchini,  cup (220 mg). MODERATE IN POTASSIUM The following foods and beverages have 50-200 mg of potassium per serving:  Apple, 1 each (150 mg).  Apple juice,  cup (150 mg).  Applesauce,  cup (90 mg).  Apricot nectar,  cup (140 mg).  Asparagus, small spears,  cup or 6 spears (155 mg).  Bagel, cinnamon raisin, 1 each (130 mg).  Bagel, egg or plain, 4 in., 1 each (70 mg).  Beans, green,  cup (90 mg).  Beans, yellow,  cup (190 mg).  Beer, regular, 12  oz (100 mg).  Beets, canned,  cup (125 mg).  Blackberries,  cup (115 mg).  Blueberries,  cup (60 mg).  Bread, whole wheat, 1 slice (70 mg).  Broccoli, raw,  cup (145 mg).  Cabbage,  cup (150 mg).  Carrots, cooked or raw,  cup (180 mg).  Cauliflower, raw,  cup (150 mg).  Celery, raw,  cup (155 mg).  Cereal, bran flakes, cup (120-150 mg).  Cheese, cottage,  cup (110 mg).  Cherries, 10 each (150 mg).  Chocolate, 1 oz bar (165 mg).  Coffee, brewed 6 oz (90 mg).  Corn,  cup or 1 ear (195 mg).  Cucumbers,  cup (80 mg).  Egg, large, 1 each (60 mg).  Eggplant,  cup (60 mg).  Endive,  raw, cup (80 mg).  English muffin, 1 each (65 mg).  Fish, orange roughy, 3 oz (150 mg).  Frankfurter, beef or pork, 1 each (75 mg).  Fruit cocktail,  cup (115 mg).  Grape juice,  cup (170 mg).  Grapefruit,  fruit (175 mg).  Grapes,  cup (155 mg).  Greens: kale, turnip, collard,  cup (110-150 mg).  Ice cream or frozen yogurt, chocolate,  cup (175 mg).  Ice cream or frozen yogurt, vanilla,  cup (120-150 mg).  Lemons, limes, 1 each (80 mg).  Lettuce, all types, 1 cup (100 mg).  Mixed vegetables,  cup (150 mg).  Mushrooms, raw,  cup (110 mg).  Nuts: walnuts, pecans, or macadamia, 1 oz (125 mg).  Oatmeal,  cup (80 mg).  Okra,  cup (110 mg).  Onions, raw,  cup (120 mg).  Peach, 1 each (185 mg).  Peaches, canned,  cup (120 mg).  Pears, canned,  cup (120 mg).  Peas, green, frozen,  cup (90 mg).  Peppers, green,  cup (130 mg).  Peppers, red,  cup (160 mg).  Pineapple juice,  cup (165 mg).  Pineapple, fresh or canned,  cup (100 mg).  Plums, 1 each (105 mg).  Pudding, vanilla,  cup (150 mg).  Raspberries,  cup (90 mg).  Rhubarb,  cup (115 mg).  Rice, wild,  cup (80 mg).  Shrimp, 3 oz (155 mg).  Spinach, raw, 1 cup (170 mg).  Strawberries,  cup (125 mg).  Summer squash  cup (175-200 mg).  Swiss chard, raw, 1 cup (135 mg).  Tangerines, 1 each (140 mg).  Tea, brewed, 6 oz (65 mg).  Turnips,  cup (140 mg).  Watermelon,  cup (85 mg).  Wine, red, table, 5 oz (180 mg).  Wine, white, table, 5 oz (100 mg). LOW IN POTASSIUM The following foods and beverages have less than 50 mg of potassium per serving.  Bread, white, 1 slice (30 mg).  Carbonated beverages, 12 oz (less than 5 mg).  Cheese, 1 oz (20-30 mg).  Cranberries,  cup (45 mg).  Cranberry juice cocktail,  cup (20 mg).  Fats and oils, 1 Tbsp (less than 5 mg).  Hummus, 1 Tbsp (32 mg).  Nectar: papaya, mango, or pear,  cup (35 mg).  Rice, white or  brown,  cup (50 mg).  Spaghetti or macaroni,  cup cooked (30 mg).  Tortilla, flour or corn, 1 each (50 mg).  Waffle, 4 in., 1 each (50 mg).  Water chestnuts,  cup (40 mg).   This information is not intended to replace advice given to you by your health care provider. Make sure you discuss any questions you have with your health care provider.   Document Released: 05/27/2005 Document Revised: 10/18/2013  Document Reviewed: 09/09/2013 Elsevier Interactive Patient Education 2016 [employer]  Probiotics WHAT ARE PROBIOTICS? Probiotics are the good bacteria and yeasts that live in your body and keep you and your digestive system healthy. Probiotics also help your body's defense (immune) system and protect your body against bad bacterial growth.  Certain foods contain probiotics, such as yogurt. Probiotics can also be purchased as a supplement. As with any supplement or drug, it is important to discuss its use with your health care provider.  WHAT AFFECTS THE BALANCE OF BACTERIA IN MY BODY? The balance of bacteria in your body can be affected by:   Antibiotic medicines. Antibiotics are sometimes necessary to treat infection. Unfortunately, they may kill good or friendly bacteria in your body as well as the bad bacteria. This may lead to stomach problems like diarrhea, gas, and cramping.  Disease. Some conditions are the result of an overgrowth of bad bacteria, yeasts, parasites, or fungi. These conditions include:   Infectious diarrhea.  Stomach and respiratory infections.  Skin infections.  Irritable bowel syndrome (IBS).  Inflammatory bowel diseases.  Ulcer due to Helicobacter pylori (H. pylori) infection.  Tooth decay and periodontal disease.  Vaginal infections. Stress and poor diet may also lower the good bacteria in your body.  WHAT TYPE OF PROBIOTIC IS RIGHT FOR ME? Probiotics are available over the counter at your local pharmacy, health food, or grocery store. They  come in many different forms, combinations of strains, and dosing strengths. Some may need to be refrigerated. Always read the label for storage and usage instructions. Specific strains have been shown to be more effective for certain conditions. Ask your health care provider what option is best for you.  WHY WOULD I NEED PROBIOTICS? There are many reasons your health care provider might recommend a probiotic supplement, including:   Diarrhea.  Constipation.  IBS.  Respiratory infections.  Yeast infections.  Acne, eczema, and other skin conditions.  Frequent urinary tract infections (UTIs). ARE THERE SIDE EFFECTS OF PROBIOTICS? Some people experience mild side effects when taking probiotics. Side effects are usually temporary and may include:   Gas.  Bloating.  Cramping. Rarely, serious side effects, such as infection or immune system changes, may occur. WHAT ELSE DO I NEED TO KNOW ABOUT PROBIOTICS?   There are many different strains of probiotics. Certain strains may be more effective depending on your condition. Probiotics are available in varying doses. Ask your health care provider which probiotic you should use and how often.   If you are taking probiotics along with antibiotics, it is generally recommended to wait at least 2 hours between taking the antibiotic and taking the probiotic.  FOR MORE INFORMATION:  [email or web address]   This information is not intended to replace advice given to you by your health care provider. Make sure you discuss any questions you have with your health care provider.   Document Released: 05/10/2014 Document Reviewed: 05/10/2014 Elsevier Interactive Patient Education 2016 [employer].  Acute Pancreatitis Acute pancreatitis is a disease in which the pancreas becomes suddenly irritated (inflamed). The pancreas is a large gland behind your stomach. The pancreas makes enzymes  that help digest food. The pancreas also makes 2 hormones that help control your blood sugar. Acute pancreatitis happens when the enzymes attack and damage the pancreas. Most attacks last a couple of days and can cause serious problems. HOME CARE  Follow your doctor's diet instructions. You may need to avoid alcohol and limit  fat in your diet.  Eat small meals often.  Drink enough fluids to keep your pee (urine) clear or pale yellow.  Only take medicines as told by your doctor.  Avoid drinking alcohol if it caused your disease.  Do not smoke.  Get plenty of rest.  Check your blood sugar at home as told by your doctor.  Keep all doctor visits as told. GET HELP IF:  You do not get better as quickly as expected.  You have new or worsening symptoms.  You have lasting pain, weakness, or feel sick to your stomach (nauseous).  You get better and then have another pain attack. GET HELP RIGHT AWAY IF:   You are unable to eat or keep fluids down.  Your pain becomes severe.  You have a fever or lasting symptoms for more than 2 to 3 days.  You have a fever and your symptoms suddenly get worse.  Your skin or the white part of your eyes turn yellow (jaundice).  You throw up (vomit).  You feel dizzy, or you pass out (faint).  Your blood sugar is high (over 300 mg/dL). MAKE SURE YOU:   Understand these instructions.  Will watch your condition.  Will get help right away if you are not doing well or get worse.   This information is not intended to replace advice given to you by your health care provider. Make sure you discuss any questions you have with your health care provider.   Document Released: 03/31/2008 Document Revised: 11/03/2014 Document Reviewed: 01/22/2012 Elsevier Interactive Patient Education 2016 [employer] Inc.  Clostridium Difficile Infection Clostridium difficile (C. difficile or C. diff) is a germ found in the intestines. C. difficile infection can happen  after taking antibiotic medicines. Antiobiotics may cause the C. difficile to grow out of control and make a toxin that causes the infection. C. difficile infection can cause watery poop (diarrhea) or severe disease. HOME CARE  Drink enough fluids to keep your pee (urine) clear or pale yellow. Avoid milk, caffeine, and alcohol.  Ask your doctor how to replace the body fluids you lost (rehydrate).  Eat small meals often. Avoid eating large meals.  Take your antibiotics as told. Finish them even if you start to feel better.  Do not  take medicines that help watery poop. These medicines may stop you from healing as fast or cause problems.  Wash your hands after using the bathroom and before touching food. Make sure people who live with you wash their hands often.  Clean all surfaces in your home. Use a product that has chlorine bleach in it. GET HELP IF:  Your watery poop lasts longer than expected.  Your watery poop comes back after you finish your antibiotic medicine.  You feel very dry or thirsty (dehydrated).  You have a fever. GET HELP RIGHT AWAY IF:   You have more stomach pain or tenderness.  You have blood in your poop (stool). Your poop may look black and tar-like.  You cannot eat or drink without throwing up (vomiting).   This information is not intended to replace advice given to you by your health care provider. Make sure you discuss any questions you have with your health care provider.   Document Released: 08/10/2009 Document Revised: 11/03/2014 Document Reviewed: 04/16/2015 Elsevier Interactive Patient Education [employer].

## 2016-05-28 NOTE — Progress Notes (Addendum)
Pt VSS.  Admitting complaints resolved at time of discharge. AVS reviewed a bedside, prescriptions reviewed at bedside. No further questions. IV removed. Tele d/c'd. Kizzie Ide, RN

## 2016-05-28 NOTE — Care Management Note (Signed)
Case Management Note  Patient Details  Name: Jesse Sparks MRN: [id number] Date of Birth: 1965-12-02  Subjective/Objective:                    Action/Plan:d/c home no needs or orders.   Expected Discharge Date:                  Expected Discharge Plan:     In-House Referral:     Discharge planning Services  CM Consult  Post Acute Care Choice:    Choice offered to:     DME Arranged:    DME Agency:     HH Arranged:    [place] Agency:     Status of Service:  Completed, signed off  If discussed at [employer] of Stay Meetings, dates discussed:    Additional Comments:  Dessa Phi, RN 05/28/2016, 12:13 PM

## 2016-05-28 NOTE — Discharge Summary (Signed)
Physician Discharge Summary  Jesse Sparks [id number] DOB: 1966-07-06 DOA: 05/25/2016  PCP: No primary care provider on file.  Admit date: 05/25/2016 Discharge date: 05/28/2016  Admitted From: Home Disposition:  Home  Recommendations for Outpatient Follow-up:  1. Follow up with PCP in 1 weeks 2. Please obtain BMP/CBC in one week  Discharge Condition: Stable CODE STATUS: FULL  Brief/Interim Summary: HPI: Jesse Sparks is a 50 y.o. male with alcoholism and nonspecific arthritis presents to the ER because of increasing weakness with nausea vomiting and diarrhea over the last 1 week. Denies any abdominal pain chest pain or shortness of breath. In the ER patient had mild epigastric tenderness. Labs revealed severe hyponatremia hypokalemia and hypomagnesemia. Patient states he drinks alcohol every day. Denies any recent sick contacts or use of antibiotics. In addition labs reveal elevated LFT and lipase. Patient is being admitted for further management of his multiple electrolyte abnormalities and nausea vomiting and diarrhea.   Nausea vomiting and diarrhea - Tested positive for c. Diff, with positive pcr testing;  sending home with prescription for flagyl and strongly advised patient to avoid alcohol especially while on antibiotics - Nausea and vomiting resolved now.    Elevated LFTs with elevated lipase - probably secondary to alcoholic hepatitis.  - CT abdomen and pelvis without any acute pathology  - Lipase improving, clinically is improving, decreased pain, no further nausea and tolerating diet.   Severe hyponatremia - multifactorial. Could be from dehydration given the low chloride levels. Could be also from alcoholism. Patient has received 1.8 L fluid in the ER.  - Continue IV fluids, sodium improved to 129 this morning - TSH normal at 1.0, cortisol appropriate at 17   Alcohol abuse- patient has been placed on CIWA protocol and supplemented with vitamins.    Hypokalemia and  hypomagnesemia - probably from diarrhea and vomiting - replace and recheck. - Pt given extra potassium prior to discharge.   Prolonged QT interval -improved after correction of electrolytes.    DVT prophylaxis: SCD Code Status: Full code Family Communication: No family bedside  Disposition Plan: Transferred to telemetry, home when electrolytes are stable   Consultants:   None   Procedures:   None   Discharge Diagnoses:  Principal Problem:   Nausea, vomiting and diarrhea Active Problems:   Electrolyte imbalance   Hyponatremia   Alcohol abuse   Elevated LFTs   Acute pancreatitis   C. difficile diarrhea    Discharge Instructions  Discharge Instructions    Diet - low sodium heart healthy    Complete by:  As directed   Discharge instructions    Complete by:  As directed   Please do not consume alcohol especially while you are on the antibiotics.   Increase activity slowly    Complete by:  As directed       Medication List    TAKE these medications   feeding supplement (ENSURE ENLIVE) Liqd Take 237 mLs by mouth 2 (two) times daily between meals.   folic acid 1 MG tablet Commonly known as:  FOLVITE Take 1 tablet (1 mg total) by mouth daily.   meloxicam 15 MG tablet Commonly known as:  MOBIC Take 15 mg by mouth daily as needed for pain. with food   metroNIDAZOLE 500 MG tablet Commonly known as:  FLAGYL Take 1 tablet (500 mg total) by mouth 3 (three) times daily. DO NOT CONSUME ALCOHOL WHILE TAKING THIS MEDICATION.   thiamine 100 MG tablet Take 1 tablet (100 mg total) by  mouth daily.      Follow-up Information    Gregor Hams, FNP. Schedule an appointment as soon as possible for a visit in 1 week(s).   Specialty:  Family Medicine Contact information: [place] [address] [phone]          Allergies  Allergen Reactions  . Tramadol Rash    Gi upset     Procedures/Studies: Ct Abdomen Pelvis W  Contrast  Result Date: 05/26/2016 CLINICAL DATA:  50 year old male with elevated LFTs, nausea and vomiting. EXAM: CT ABDOMEN AND PELVIS WITH CONTRAST TECHNIQUE: Multidetector CT imaging of the abdomen and pelvis was performed using the standard protocol following bolus administration of intravenous contrast. CONTRAST:  147mL ISOVUE-300 IOPAMIDOL (ISOVUE-300) INJECTION 61% COMPARISON:  Abdominal radiograph dated 10/29/2012 FINDINGS: The visualized lung bases are clear. There is coronary vascular calcification. No intra-abdominal free air or free fluid. Apparent diffuse fatty infiltration of the liver. The gallbladder, pancreas, spleen, adrenal glands, kidneys, visualized ureters, and urinary bladder appear unremarkable. The prostate and seminal vesicles are grossly unremarkable. There is no evidence of bowel obstruction or active inflammation. Normal appendix. There is moderate aortoiliac atherosclerotic disease. The origins of the celiac axis, SMA, IMA as well as the origins of the renal arteries are patent. No portal venous gas identified. There is no adenopathy. The abdominal wall soft tissues appear unremarkable. There is degenerative changes of the spine. No acute fracture. IMPRESSION: No acute intra-abdominal pelvic pathology. Fatty liver. Electronically Signed   By: Anner Crete M.D.   On: 05/26/2016 02:14  (Echo, Carotid, EGD, Colonoscopy, ERCP)   Subjective: Pt eating and drinking well and asking to go home.  Diarrhea is better now.    Discharge Exam: Vitals:   05/27/16 2351 05/28/16 0611  BP: (!) 126/99 (!) 137/99  Pulse: 85 87  Resp: 18 18  Temp: 98.4 F (36.9 C) 98.3 F (36.8 C)   Vitals:   05/27/16 1138 05/27/16 1435 05/27/16 2351 05/28/16 0611  BP: 95/66 104/88 (!) 126/99 (!) 137/99  Pulse: 88 82 85 87  Resp: 18 18 18 18   Temp: 98.2 F (36.8 C) 99.1 F (37.3 C) 98.4 F (36.9 C) 98.3 F (36.8 C)  TempSrc: Oral Axillary Oral Oral  SpO2: 100% 98% 100% 100%  Weight:       Height:            Eyes: PERRL, lids and conjunctivae normal Respiratory: clear to auscultation bilaterally, no wheezing, no crackles. Normal respiratory effort. No accessory muscle use.  Cardiovascular: Regular rate and rhythm, no murmurs / rubs / gallops. No LE edema. 2+ pedal pulses. Abdomen: no tenderness. Bowel sounds positive.  Musculoskeletal: no clubbing / cyanosis. No joint deformity upper and lower extremities. No contractures. Normal muscle tone.  Neurologic:  nonfocal, no asterixis  Psychiatric: Normal judgment and insight. Alert and oriented x 3. Normal mood.    The results of significant diagnostics from this hospitalization (including imaging, microbiology, ancillary and laboratory) are listed below for reference.     Microbiology: Recent Results (from the past 240 hour(s))  MRSA PCR Screening     Status: None   Collection Time: 05/26/16  2:52 AM  Result Value Ref Range Status   MRSA by PCR NEGATIVE NEGATIVE Final    Comment:        The GeneXpert MRSA Assay (FDA approved for NASAL specimens only), is one component of a comprehensive MRSA colonization surveillance program. It is not intended to diagnose MRSA infection nor  to guide or monitor treatment for MRSA infections.   Gastrointestinal Panel by PCR , Stool     Status: None   Collection Time: 05/26/16  7:00 PM  Result Value Ref Range Status   Campylobacter species NOT DETECTED NOT DETECTED Final   Plesimonas shigelloides NOT DETECTED NOT DETECTED Final   Salmonella species NOT DETECTED NOT DETECTED Final   Yersinia enterocolitica NOT DETECTED NOT DETECTED Final   Vibrio species NOT DETECTED NOT DETECTED Final   Vibrio cholerae NOT DETECTED NOT DETECTED Final   Enteroaggregative E coli (EAEC) NOT DETECTED NOT DETECTED Final   Enteropathogenic E coli (EPEC) NOT DETECTED NOT DETECTED Final   Enterotoxigenic E coli (ETEC) NOT DETECTED NOT DETECTED Final   Shiga like toxin producing E coli (STEC) NOT  DETECTED NOT DETECTED Final   E. coli O157 NOT DETECTED NOT DETECTED Final   Shigella/Enteroinvasive E coli (EIEC) NOT DETECTED NOT DETECTED Final   Cryptosporidium NOT DETECTED NOT DETECTED Final   Cyclospora cayetanensis NOT DETECTED NOT DETECTED Final   Entamoeba histolytica NOT DETECTED NOT DETECTED Final   Giardia lamblia NOT DETECTED NOT DETECTED Final   Adenovirus F40/41 NOT DETECTED NOT DETECTED Final   Astrovirus NOT DETECTED NOT DETECTED Final   Norovirus GI/GII NOT DETECTED NOT DETECTED Final   Rotavirus A NOT DETECTED NOT DETECTED Final   Sapovirus (I, II, IV, and V) NOT DETECTED NOT DETECTED Final  C difficile quick scan w PCR reflex     Status: Abnormal   Collection Time: 05/27/16  9:00 AM  Result Value Ref Range Status   C Diff antigen POSITIVE (A) NEGATIVE Final   C Diff toxin NEGATIVE NEGATIVE Final   C Diff interpretation Results are indeterminate. See PCR results.  Final  Clostridium Difficile by PCR     Status: Abnormal   Collection Time: 05/27/16  9:00 AM  Result Value Ref Range Status   Toxigenic C Difficile by pcr POSITIVE (A) NEGATIVE Final    Comment: Performed at [facility]: BNP (last 3 results) No results for input(s): BNP in the last 8760 hours. Basic Metabolic Panel:  Recent Labs Lab 05/25/16 2243 05/26/16 0349  05/26/16 1033 05/26/16 1403 05/26/16 1850 05/27/16 0321 05/28/16 0515  NA 121*  --   < > 125* 128* 128* 129* 134*  K 2.1*  --   < > 2.5* 3.1* 3.2* 2.9* 3.4*  CL 69*  --   < > 81* 84* 86* 91* 99*  CO2 32  --   < > 32 32 32 31 27  GLUCOSE 114*  --   < > 91 85 100* 128* 95  BUN 19  --   < > 14 13 13 11 10   CREATININE 0.93  --   < > 0.79 0.85 0.78 0.75 0.73  CALCIUM 9.0  --   < > 8.2* 8.2* 8.6* 8.2* 8.1*  MG 1.3* 1.9  --   --   --   --  1.4*  --   PHOS  --   --   --   --   --   --  1.3*  --   < > = values in this interval not displayed. Liver Function Tests:  Recent Labs Lab 05/25/16 2243 05/26/16 0654  05/27/16 0321 05/28/16 0515  AST 67* 40 63* 132*  ALT 83* 60 71* 157*  ALKPHOS 102 77 70 75  BILITOT 2.0* 1.3* 0.8 0.3  PROT 8.4* 6.8 6.3* 6.5  ALBUMIN  4.1 3.3* 3.0* 3.0*    Recent Labs Lab 05/25/16 2243 05/27/16 0321  LIPASE 371* 309*   No results for input(s): AMMONIA in the last 168 hours. CBC:  Recent Labs Lab 05/25/16 2243 05/26/16 0349 05/27/16 0321 05/28/16 0515  WBC 10.1 7.0 4.3 5.9  NEUTROABS 7.3 4.8  --   --   HGB 13.8 11.4* 11.3* 11.8*  HCT 36.8* 31.1* 32.4* 33.2*  MCV 98.4 99.0 101.9* 103.1*  PLT 332 279 259 283   Cardiac Enzymes:  Recent Labs Lab 05/26/16 0654  CKTOTAL 46*  TROPONINI <0.03   BNP: Invalid input(s): POCBNP CBG:  Recent Labs Lab 05/26/16 2334 05/27/16 1144 05/27/16 1734 05/27/16 2349 05/28/16 0608  GLUCAP 129* 129* 108* 127* 107*   D-Dimer No results for input(s): DDIMER in the last 72 hours. Hgb A1c No results for input(s): HGBA1C in the last 72 hours. Lipid Profile No results for input(s): CHOL, HDL, LDLCALC, TRIG, CHOLHDL, LDLDIRECT in the last 72 hours. Thyroid function studies  Recent Labs  05/26/16 0704  TSH 1.009   Anemia work up No results for input(s): VITAMINB12, FOLATE, FERRITIN, TIBC, IRON, RETICCTPCT in the last 72 hours. Urinalysis    Component Value Date/Time   COLORURINE AMBER (A) 05/25/2016 0032   APPEARANCEUR CLEAR 05/25/2016 0032   LABSPEC 1.015 05/25/2016 0032   PHURINE 6.0 05/25/2016 0032   GLUCOSEU NEGATIVE 05/25/2016 0032   HGBUR NEGATIVE 05/25/2016 0032   BILIRUBINUR MODERATE (A) 05/25/2016 0032   KETONESUR 15 (A) 05/25/2016 0032   PROTEINUR NEGATIVE 05/25/2016 0032   UROBILINOGEN 1.0 10/29/2012 0240   NITRITE NEGATIVE 05/25/2016 0032   LEUKOCYTESUR NEGATIVE 05/25/2016 0032   Sepsis Labs Invalid input(s): PROCALCITONIN,  WBC,  LACTICIDVEN Microbiology Recent Results (from the past 240 hour(s))  MRSA PCR Screening     Status: None   Collection Time: 05/26/16  2:52 AM  Result  Value Ref Range Status   MRSA by PCR NEGATIVE NEGATIVE Final    Comment:        The GeneXpert MRSA Assay (FDA approved for NASAL specimens only), is one component of a comprehensive MRSA colonization surveillance program. It is not intended to diagnose MRSA infection nor to guide or monitor treatment for MRSA infections.   Gastrointestinal Panel by PCR , Stool     Status: None   Collection Time: 05/26/16  7:00 PM  Result Value Ref Range Status   Campylobacter species NOT DETECTED NOT DETECTED Final   Plesimonas shigelloides NOT DETECTED NOT DETECTED Final   Salmonella species NOT DETECTED NOT DETECTED Final   Yersinia enterocolitica NOT DETECTED NOT DETECTED Final   Vibrio species NOT DETECTED NOT DETECTED Final   Vibrio cholerae NOT DETECTED NOT DETECTED Final   Enteroaggregative E coli (EAEC) NOT DETECTED NOT DETECTED Final   Enteropathogenic E coli (EPEC) NOT DETECTED NOT DETECTED Final   Enterotoxigenic E coli (ETEC) NOT DETECTED NOT DETECTED Final   Shiga like toxin producing E coli (STEC) NOT DETECTED NOT DETECTED Final   E. coli O157 NOT DETECTED NOT DETECTED Final   Shigella/Enteroinvasive E coli (EIEC) NOT DETECTED NOT DETECTED Final   Cryptosporidium NOT DETECTED NOT DETECTED Final   Cyclospora cayetanensis NOT DETECTED NOT DETECTED Final   Entamoeba histolytica NOT DETECTED NOT DETECTED Final   Giardia lamblia NOT DETECTED NOT DETECTED Final   Adenovirus F40/41 NOT DETECTED NOT DETECTED Final   Astrovirus NOT DETECTED NOT DETECTED Final   Norovirus GI/GII NOT DETECTED NOT DETECTED Final   Rotavirus A NOT DETECTED  NOT DETECTED Final   Sapovirus (I, II, IV, and V) NOT DETECTED NOT DETECTED Final  C difficile quick scan w PCR reflex     Status: Abnormal   Collection Time: 05/27/16  9:00 AM  Result Value Ref Range Status   C Diff antigen POSITIVE (A) NEGATIVE Final   C Diff toxin NEGATIVE NEGATIVE Final   C Diff interpretation Results are indeterminate. See PCR  results.  Final  Clostridium Difficile by PCR     Status: Abnormal   Collection Time: 05/27/16  9:00 AM  Result Value Ref Range Status   Toxigenic C Difficile by pcr POSITIVE (A) NEGATIVE Final    Comment: Performed at [facility]   Time coordinating discharge: 31 mins  SIGNED:   Irwin Brakeman, MD  [facility] 05/28/2016, 11:09 AM Pager   If 7PM-7AM, please contact night-coverage [email or web address] Password [email or web address]

## 2016-11-19 DIAGNOSIS — R1111 Vomiting without nausea: Secondary | ICD-10-CM | POA: Diagnosis not present

## 2016-11-19 DIAGNOSIS — R062 Wheezing: Secondary | ICD-10-CM | POA: Diagnosis not present

## 2016-11-19 DIAGNOSIS — T148XXA Other injury of unspecified body region, initial encounter: Secondary | ICD-10-CM | POA: Diagnosis not present

## 2016-12-22 DIAGNOSIS — R740 Nonspecific elevation of levels of transaminase and lactic acid dehydrogenase [LDH]: Secondary | ICD-10-CM | POA: Diagnosis not present

## 2017-04-21 DIAGNOSIS — F5101 Primary insomnia: Secondary | ICD-10-CM | POA: Diagnosis not present

## 2017-07-29 IMAGING — DX DG CHEST 1V PORT
1 series · 2 of 2 positions shown · non-contrast
Comparison: 10/29/2012

CLINICAL DATA: Pt unable to properly communicate history. Per ED
notes:Pt c/o fever, vomiting x 3 days. Pt oral temperature reading
97.7 at this time. Fever at home. Productive cough, weakness,
shortness of breath. Confusion.

EXAM:
PORTABLE CHEST 1 VIEW

[Series 1: chest ap · 0.14mm/px · 2 of 2 slices shown]
[im 1/2]
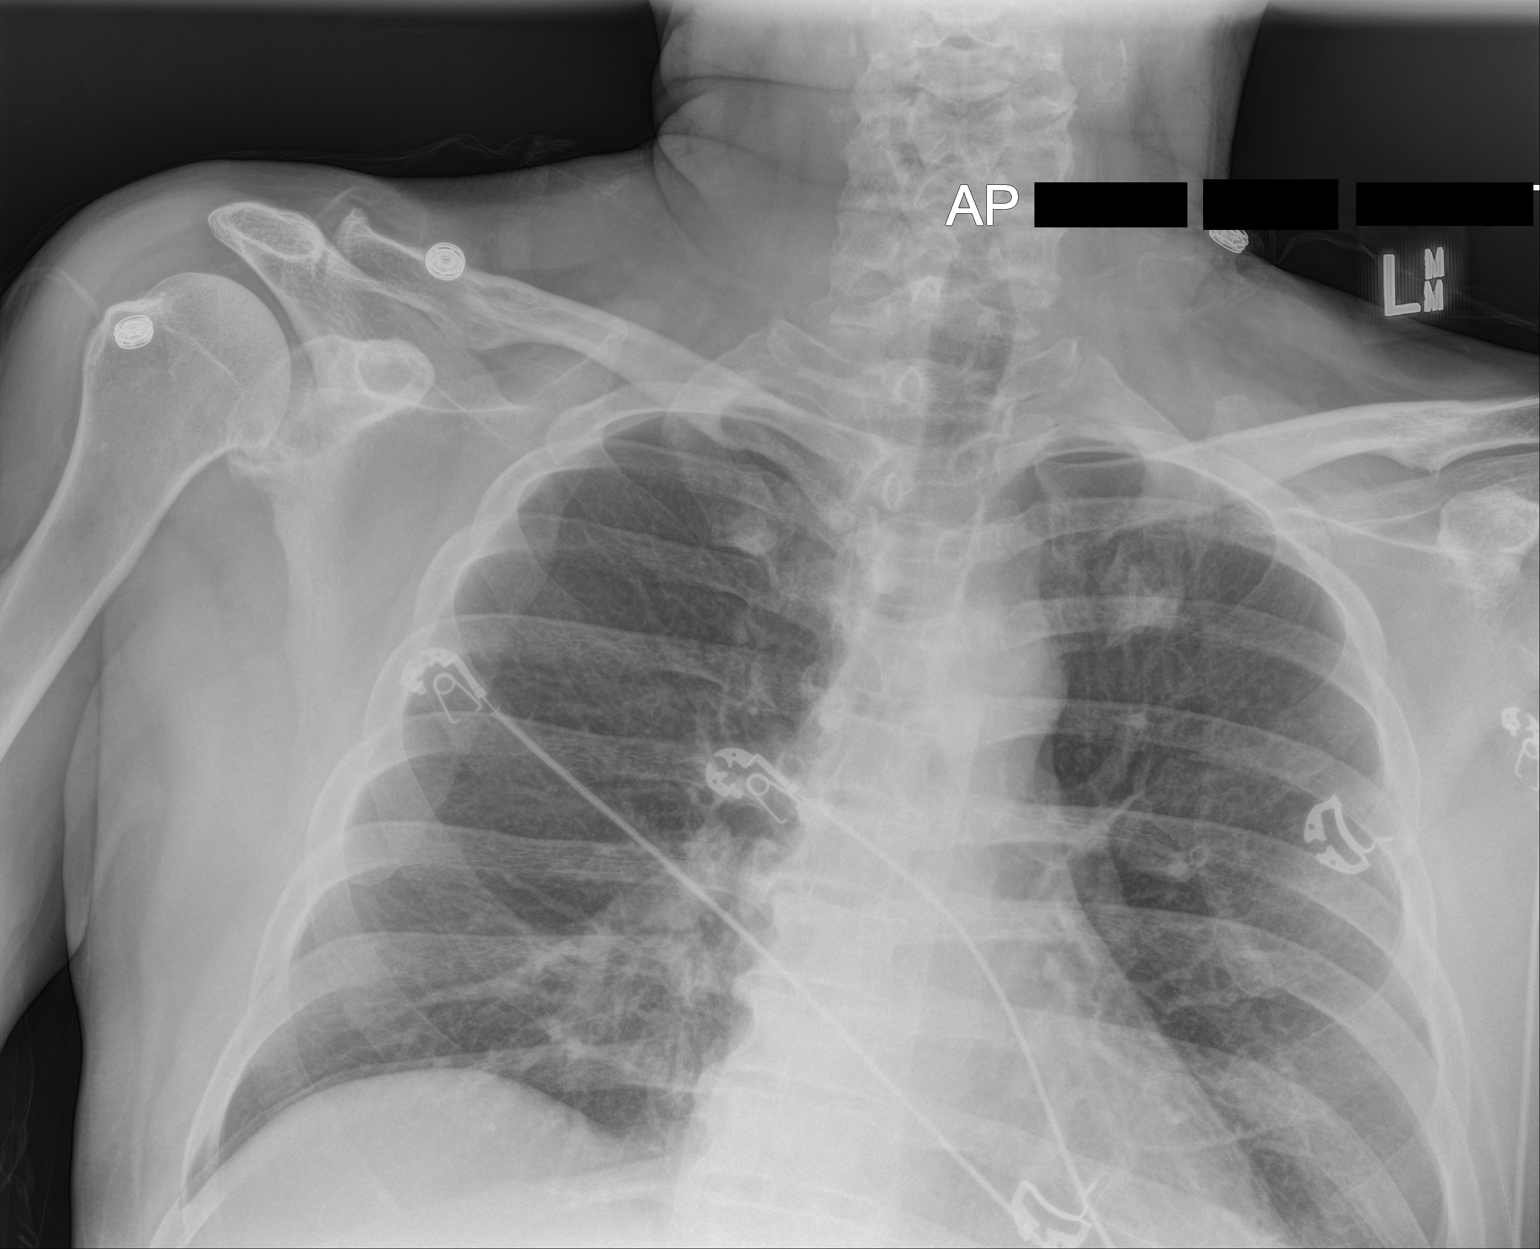
[im 2/2]
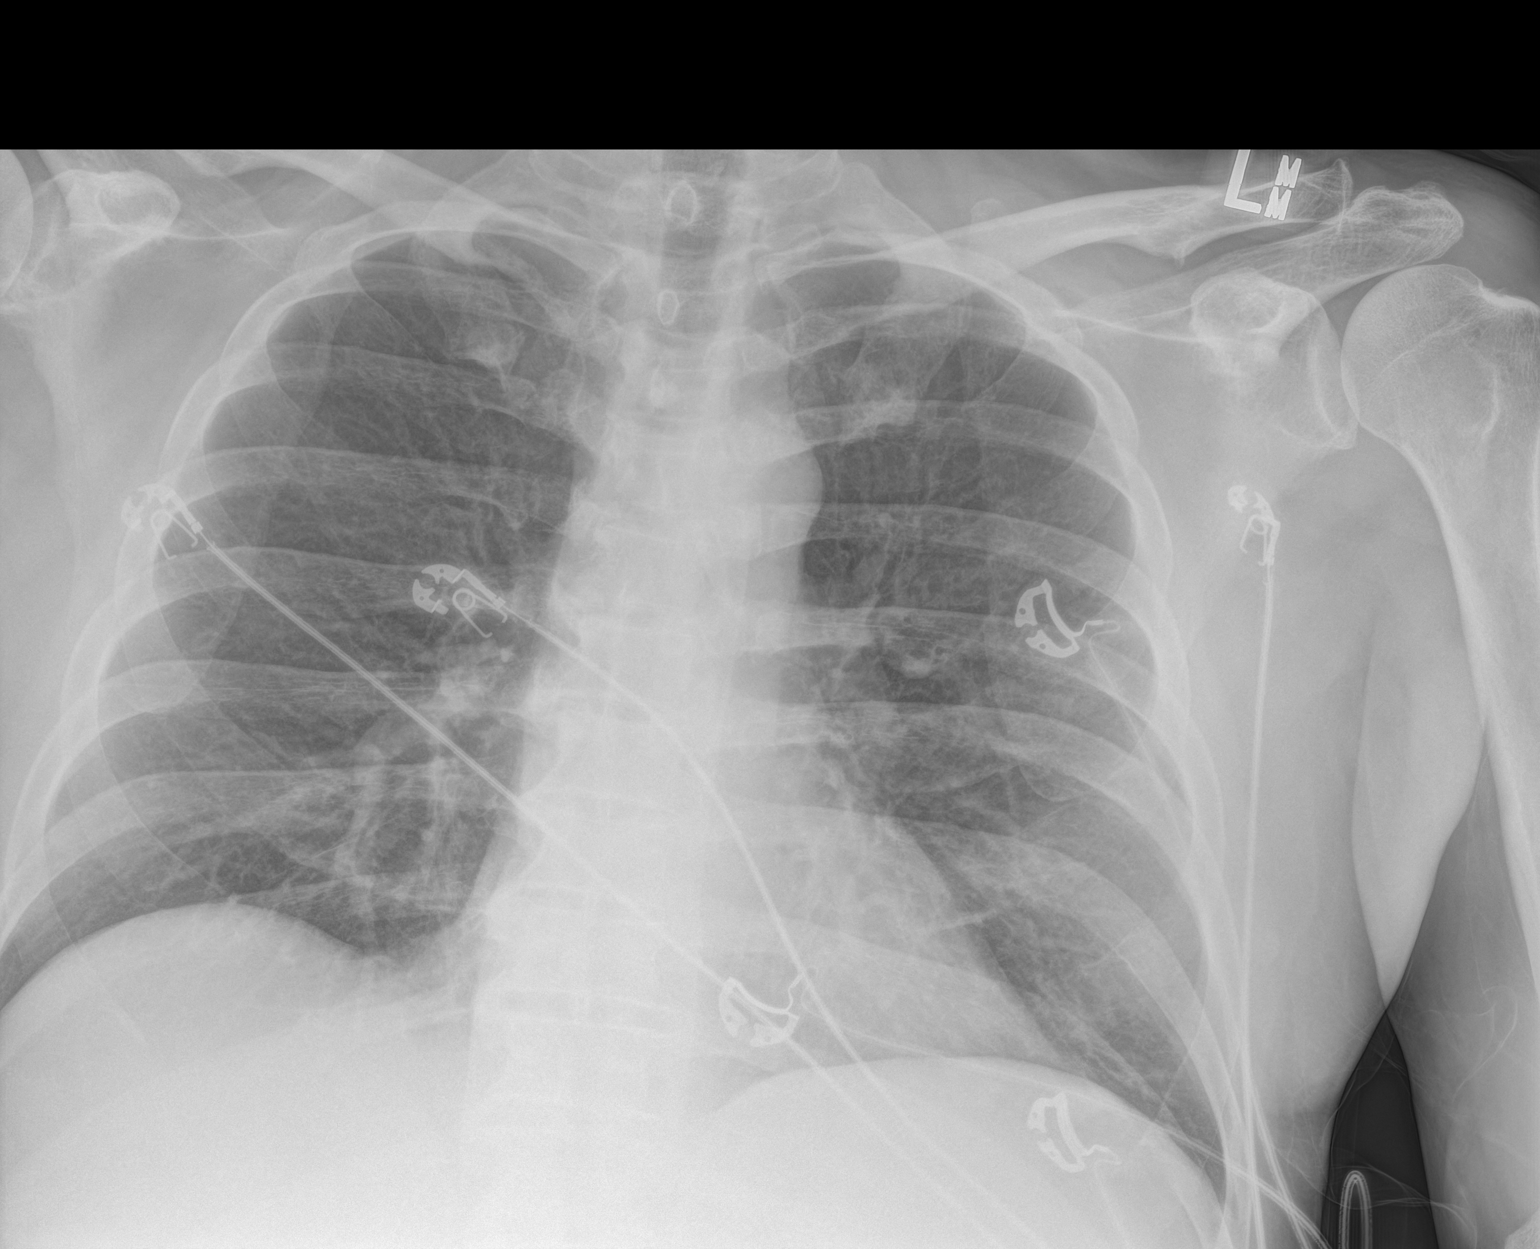

[2 of 2 positions shown; findings below may reference images not displayed]

FINDINGS: Shallow lung inflation. Subsegmental atelectasis or scarring
identified at both lung bases. There are no consolidations. No
pleural effusions or evidence for pulmonary edema.
IMPRESSION: Bibasilar atelectasis or scarring.

## 2017-08-24 DIAGNOSIS — R110 Nausea: Secondary | ICD-10-CM | POA: Diagnosis not present

## 2017-08-24 DIAGNOSIS — I10 Essential (primary) hypertension: Secondary | ICD-10-CM | POA: Diagnosis not present

## 2017-08-24 DIAGNOSIS — F5101 Primary insomnia: Secondary | ICD-10-CM | POA: Diagnosis not present

## 2017-09-16 DIAGNOSIS — K852 Alcohol induced acute pancreatitis without necrosis or infection: Secondary | ICD-10-CM | POA: Diagnosis not present

## 2017-10-14 DIAGNOSIS — R748 Abnormal levels of other serum enzymes: Secondary | ICD-10-CM | POA: Diagnosis not present

## 2018-02-03 IMAGING — CT CT ABD-PELV W/ CM
2 of 5 series · 16 of 46 positions shown, 18 images · IV contrast (ISOVUE)
Comparison: Abdominal radiograph dated 10/29/2012

CLINICAL DATA: 50-year-old male with elevated LFTs, nausea and
vomiting.

EXAM:
CT ABDOMEN AND PELVIS WITH CONTRAST
TECHNIQUE: Multidetector CT imaging of the abdomen and pelvis was performed
using the standard protocol following bolus administration of
intravenous contrast.
CONTRAST:  100mL B7LXVK-166 IOPAMIDOL (B7LXVK-166) INJECTION 61%

[Series 2: abd/pel with · axial · 0.78mm/px · z∈[+1183,+1573]mm · 13 of 90 slices shown, 15 images]
[im 6/90  soft-tissue]
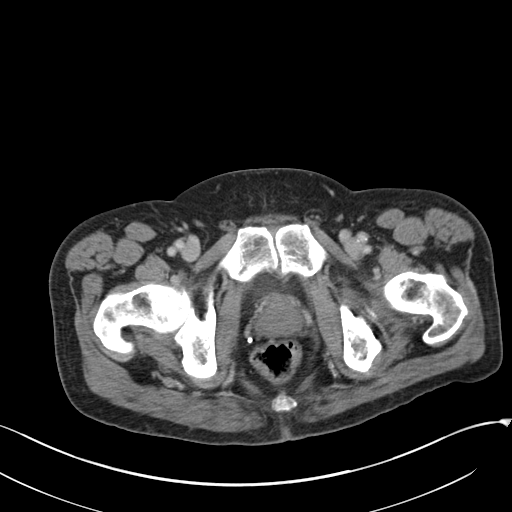
[im 6/90  bone]
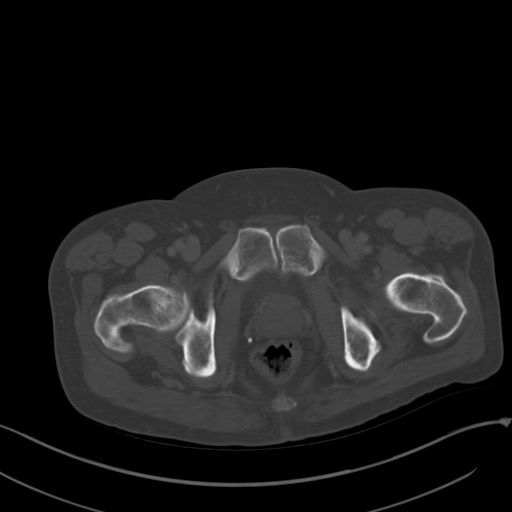
[im 12/90  soft-tissue]
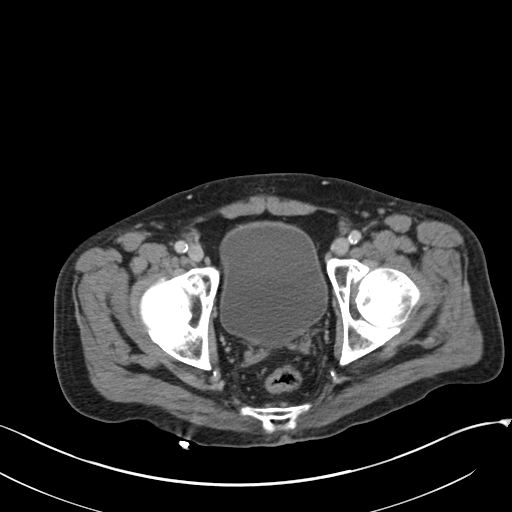
[im 17/90  soft-tissue]
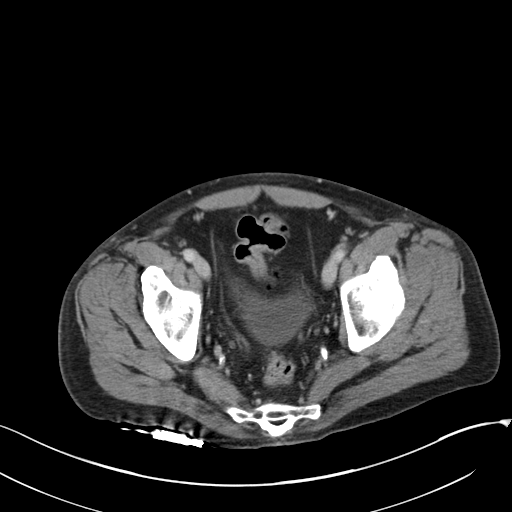
[im 28/90  soft-tissue]
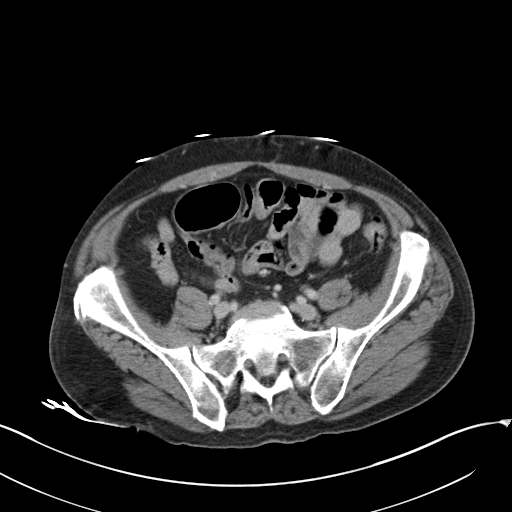
[im 34/90  soft-tissue]
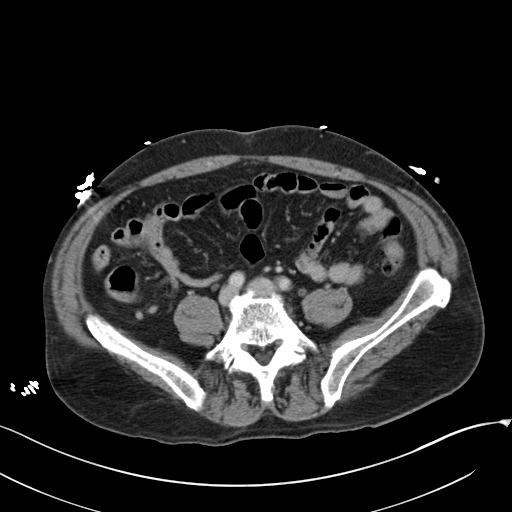
[im 39/90  soft-tissue]
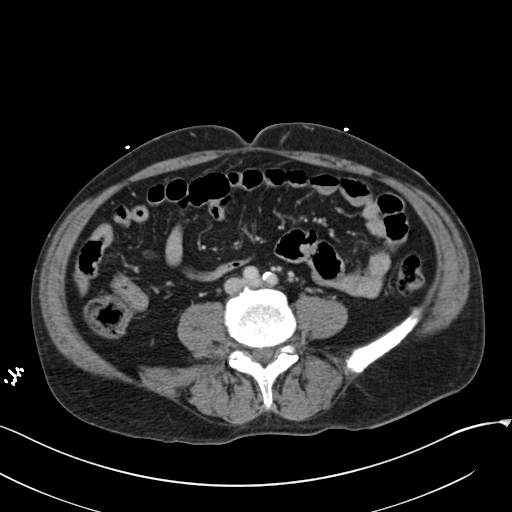
[im 45/90  soft-tissue]
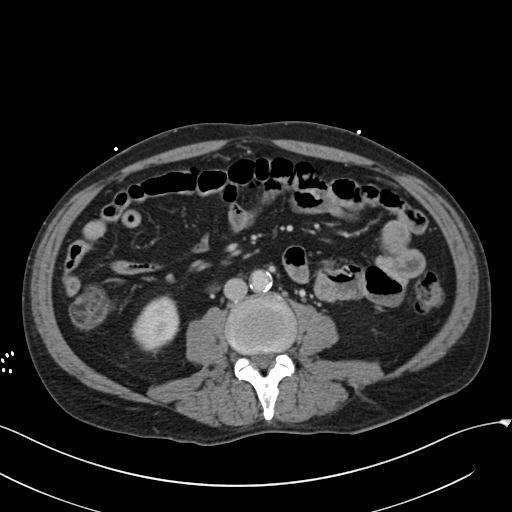
[im 51/90  soft-tissue]
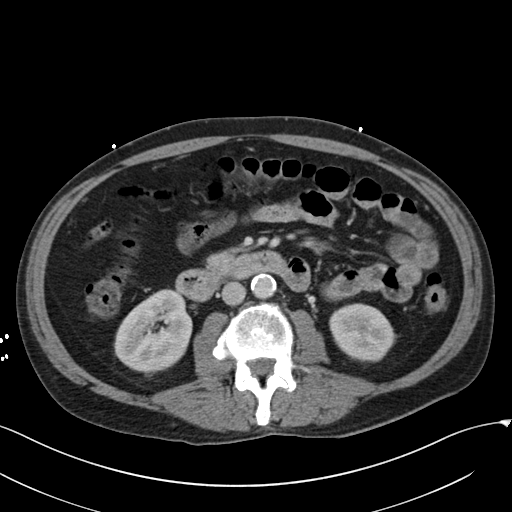
[im 56/90  soft-tissue]
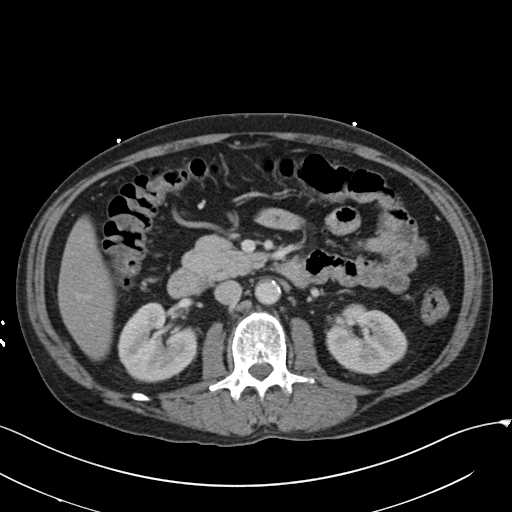
[im 56/90  bone]
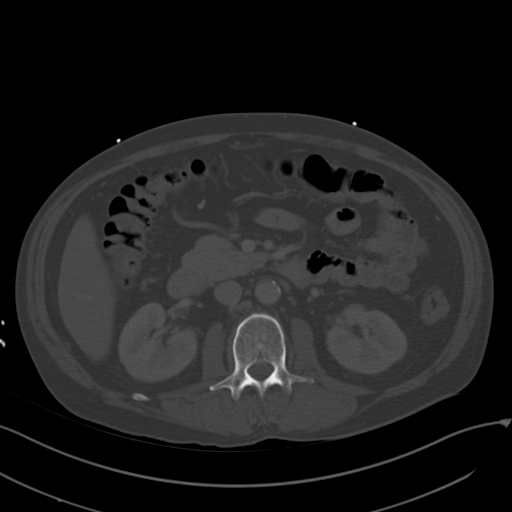
[im 62/90  soft-tissue]
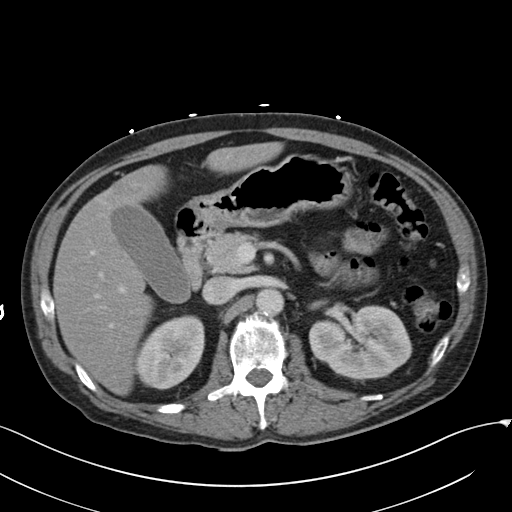
[im 73/90  soft-tissue]
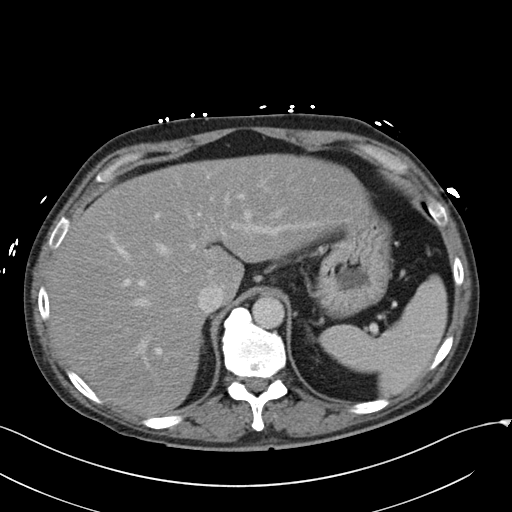
[im 78/90  soft-tissue]
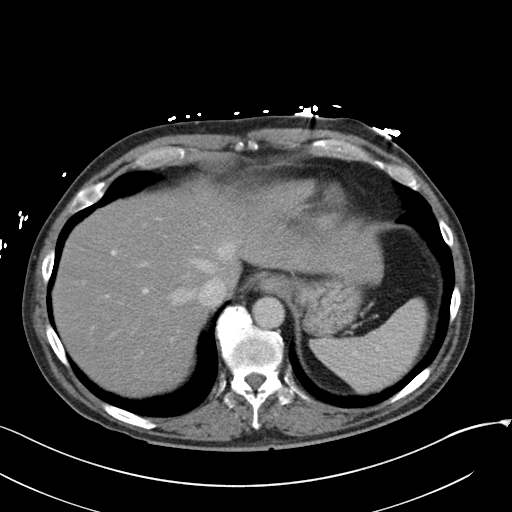
[im 84/90  soft-tissue]
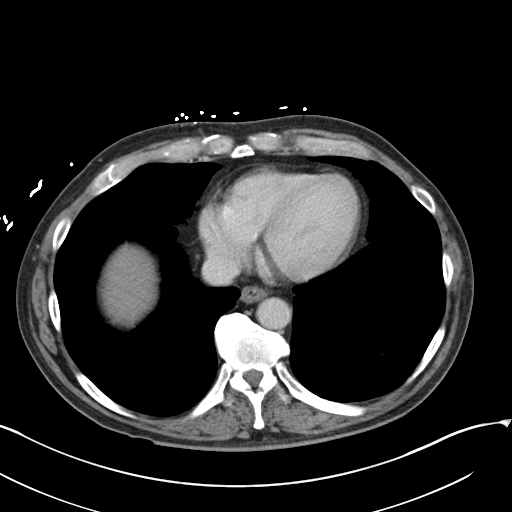

[Series 4: coronal a/|p · coronal · 0.71mm/px · 3 of 126 slices shown]
[im 42/126  soft-tissue]
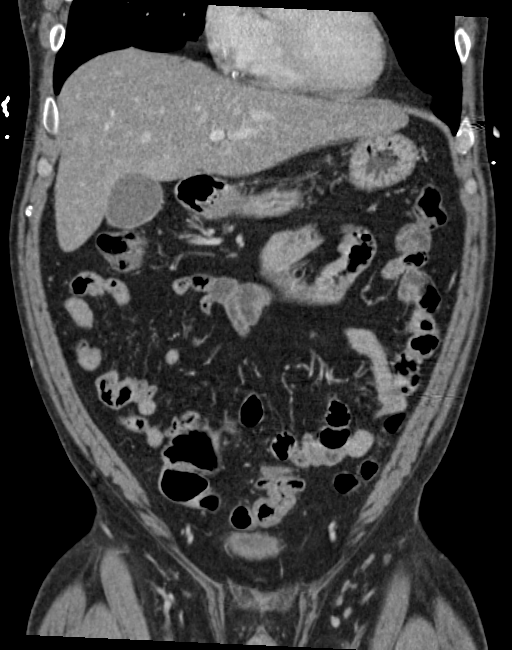
[im 56/126  soft-tissue]
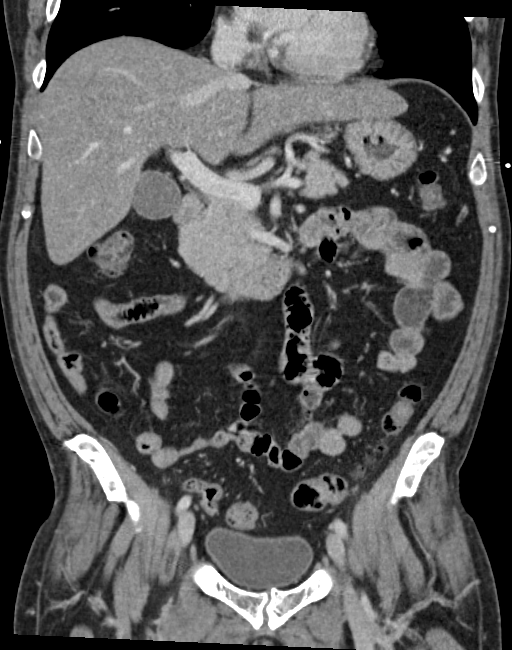
[im 70/126  soft-tissue]
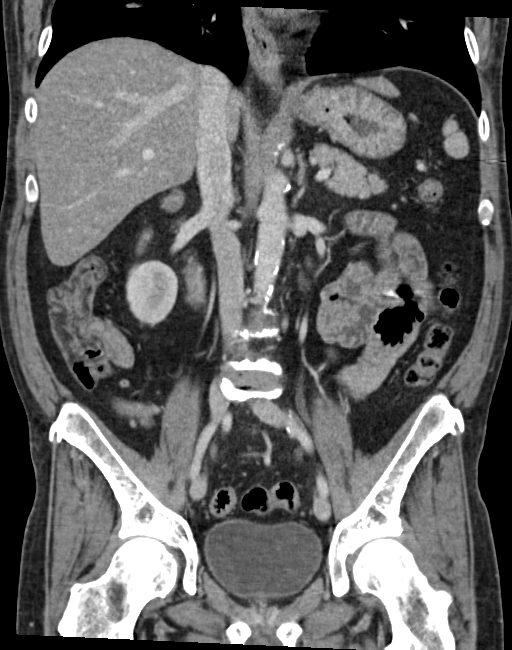

[16 of 46 positions shown; findings below may reference images not displayed]

FINDINGS: The visualized lung bases are clear. There is coronary vascular
calcification. No intra-abdominal free air or free fluid.

Apparent diffuse fatty infiltration of the liver. The gallbladder,
pancreas, spleen, adrenal glands, kidneys, visualized ureters, and
urinary bladder appear unremarkable. The prostate and seminal
vesicles are grossly unremarkable.

There is no evidence of bowel obstruction or active inflammation.
Normal appendix.

There is moderate aortoiliac atherosclerotic disease. The origins of
the celiac axis, SMA, IMA as well as the origins of the renal
arteries are patent. No portal venous gas identified. There is no
adenopathy. The abdominal wall soft tissues appear unremarkable.
There is degenerative changes of the spine. No acute fracture.
IMPRESSION: No acute intra-abdominal pelvic pathology.

Fatty liver.

## 2018-08-30 DIAGNOSIS — F5101 Primary insomnia: Secondary | ICD-10-CM | POA: Diagnosis not present

## 2018-09-06 DIAGNOSIS — R000 Tachycardia, unspecified: Secondary | ICD-10-CM | POA: Diagnosis not present

## 2018-09-06 DIAGNOSIS — R05 Cough: Secondary | ICD-10-CM | POA: Diagnosis not present

## 2018-09-06 DIAGNOSIS — K625 Hemorrhage of anus and rectum: Secondary | ICD-10-CM | POA: Diagnosis not present

## 2018-09-06 DIAGNOSIS — I10 Essential (primary) hypertension: Secondary | ICD-10-CM | POA: Diagnosis not present

## 2018-09-06 DIAGNOSIS — R7989 Other specified abnormal findings of blood chemistry: Secondary | ICD-10-CM | POA: Diagnosis not present

## 2018-09-06 DIAGNOSIS — I493 Ventricular premature depolarization: Secondary | ICD-10-CM | POA: Diagnosis not present

## 2018-09-06 DIAGNOSIS — R112 Nausea with vomiting, unspecified: Secondary | ICD-10-CM | POA: Diagnosis not present

## 2018-09-06 DIAGNOSIS — K859 Acute pancreatitis without necrosis or infection, unspecified: Secondary | ICD-10-CM | POA: Diagnosis not present

## 2018-09-06 DIAGNOSIS — E871 Hypo-osmolality and hyponatremia: Secondary | ICD-10-CM | POA: Diagnosis not present

## 2018-09-06 DIAGNOSIS — K760 Fatty (change of) liver, not elsewhere classified: Secondary | ICD-10-CM | POA: Diagnosis not present

## 2018-09-06 DIAGNOSIS — E876 Hypokalemia: Secondary | ICD-10-CM | POA: Diagnosis not present

## 2018-09-13 DIAGNOSIS — K852 Alcohol induced acute pancreatitis without necrosis or infection: Secondary | ICD-10-CM | POA: Diagnosis not present

## 2018-09-20 DIAGNOSIS — K859 Acute pancreatitis without necrosis or infection, unspecified: Secondary | ICD-10-CM | POA: Diagnosis not present

## 2018-09-27 DIAGNOSIS — K852 Alcohol induced acute pancreatitis without necrosis or infection: Secondary | ICD-10-CM | POA: Diagnosis not present

## 2018-10-22 DIAGNOSIS — R030 Elevated blood-pressure reading, without diagnosis of hypertension: Secondary | ICD-10-CM | POA: Diagnosis not present

## 2018-12-17 DIAGNOSIS — K649 Unspecified hemorrhoids: Secondary | ICD-10-CM | POA: Diagnosis not present

## 2018-12-17 DIAGNOSIS — K625 Hemorrhage of anus and rectum: Secondary | ICD-10-CM | POA: Diagnosis not present

## 2019-01-13 DIAGNOSIS — K620 Anal polyp: Secondary | ICD-10-CM | POA: Diagnosis not present

## 2019-01-13 DIAGNOSIS — Z1211 Encounter for screening for malignant neoplasm of colon: Secondary | ICD-10-CM | POA: Diagnosis not present

## 2019-01-13 DIAGNOSIS — D123 Benign neoplasm of transverse colon: Secondary | ICD-10-CM | POA: Diagnosis not present

## 2022-12-26 DEATH — deceased
